# Patient Record
Sex: Female | Born: 1990 | Race: Black or African American | Hispanic: No | Marital: Married | Smoking: Former smoker
Health system: Southern US, Community
[De-identification: ages and names within clinical notes are randomized; demographics above are authoritative.]

## PROBLEM LIST (undated history)

## (undated) ENCOUNTER — Ambulatory Visit

## (undated) DIAGNOSIS — M419 Scoliosis, unspecified: Secondary | ICD-10-CM

## (undated) DIAGNOSIS — R011 Cardiac murmur, unspecified: Secondary | ICD-10-CM

## (undated) DIAGNOSIS — O039 Complete or unspecified spontaneous abortion without complication: Secondary | ICD-10-CM

## (undated) HISTORY — DX: Complete or unspecified spontaneous abortion without complication: O03.9

## (undated) HISTORY — DX: Cardiac murmur, unspecified: R01.1

## (undated) HISTORY — PX: DILATION AND CURETTAGE OF UTERUS: SHX78

## (undated) HISTORY — PX: BACK SURGERY: SHX140

---
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
## 2010-10-19 HISTORY — PX: WISDOM TOOTH EXTRACTION: SHX21

## 2018-08-01 ENCOUNTER — Other Ambulatory Visit: Payer: Self-pay

## 2018-08-01 ENCOUNTER — Encounter: Payer: Self-pay | Admitting: Emergency Medicine

## 2018-08-01 ENCOUNTER — Ambulatory Visit
Admission: EM | Admit: 2018-08-01 | Discharge: 2018-08-01 | Disposition: A | Discharge status: Home / Self Care | Payer: Self-pay | Attending: Family Medicine | Admitting: Family Medicine

## 2018-08-01 DIAGNOSIS — Z3201 Encounter for pregnancy test, result positive: Secondary | ICD-10-CM

## 2018-08-01 DIAGNOSIS — Z3490 Encounter for supervision of normal pregnancy, unspecified, unspecified trimester: Secondary | ICD-10-CM

## 2018-08-01 HISTORY — DX: Scoliosis, unspecified: M41.9

## 2018-08-01 LAB — PREGNANCY, URINE: PREG TEST UR: POSITIVE — AB

## 2018-08-01 NOTE — ED Provider Notes (Signed)
[facility]    CSN: [id number] Arrival date & time: 08/01/18  1325     History   Chief Complaint Chief Complaint  Patient presents with  . Amenorrhea    HPI Pamela Mosley is a 27 y.o. female.   27 yo female here for pregnancy test. States he last menstrual period was end of August. Denies any complaints. Denies pain, bleeding, vomiting.   The history is provided by the patient.    Past Medical History:  Diagnosis Date  . Scoliosis     There are no active problems to display for this patient.   Past Surgical History:  Procedure Laterality Date  . BACK SURGERY     for scoliosis    OB History   None      Home Medications    Prior to Admission medications   Not on File    Family History Family History  Problem Relation Age of Onset  . Hypertension Mother   . Kidney disease Mother   . Stroke Mother   . Alcohol abuse Mother   . Drug abuse Mother   . Osteoarthritis Mother   . Drug abuse Father   . Cancer Father   . Hyperlipidemia Father   . Hypertension Father     Social History Social History   Tobacco Use  . Smoking status: Current Every Day Smoker    Packs/day: 0.25    Years: 10.00    Pack years: 2.50    Types: Cigarettes  . Smokeless tobacco: Never Used  Substance Use Topics  . Alcohol use: Yes    Comment: socially  . Drug use: Yes    Frequency: 2.0 times per week    Types: Marijuana     Allergies   Latex   Review of Systems Review of Systems   Physical Exam Triage Vital Signs ED Triage Vitals  Enc Vitals Group     BP 08/01/18 1336 105/64     Pulse Rate 08/01/18 1336 83     Resp 08/01/18 1336 16     Temp 08/01/18 1336 98.8 F (37.1 C)     Temp Source 08/01/18 1336 Oral     SpO2 08/01/18 1336 100 %     Weight 08/01/18 1337 110 lb (49.9 kg)     Height 08/01/18 1337 5\' 2"  (1.575 m)     Head Circumference --      Peak Flow --      Pain Score 08/01/18 1336 0     Pain Loc --      Pain Edu? --    Excl. in GC? --    No data found.  Updated Vital Signs BP 105/64 (BP Location: Left Arm)   Pulse 83   Temp 98.8 F (37.1 C) (Oral)   Resp 16   Ht 5\' 2"  (1.575 m)   Wt 49.9 kg   LMP 06/18/2018 (Approximate)   SpO2 100%   BMI 20.12 kg/m   Visual Acuity Right Eye Distance:   Left Eye Distance:   Bilateral Distance:    Right Eye Near:   Left Eye Near:    Bilateral Near:     Physical Exam  Constitutional: She appears well-developed and well-nourished. No distress.  Skin: She is not diaphoretic.  Nursing note and vitals reviewed.    UC Treatments / Results  Labs (all labs ordered are listed, but only abnormal results are displayed) Labs Reviewed  PREGNANCY, URINE - Abnormal; Notable for the following components:  Result Value   Preg Test, Ur POSITIVE (*)    All other components within normal limits    EKG None  Radiology No results found.  Procedures Procedures (including critical care time)  Medications Ordered in UC Medications - No data to display  Initial Impression / Assessment and Plan / UC Course  I have reviewed the triage vital signs and the nursing notes.  Pertinent labs & imaging results that were available during my care of the patient were reviewed by me and considered in my medical decision making (see chart for details).      Final Clinical Impressions(s) / UC Diagnoses   Final diagnoses:  Pregnancy, unspecified gestational age     Discharge Instructions     Establish care with a OB/GYN as soon as possible Start prenatal vitamins daily    ED Prescriptions    None      1. Lab result and diagnosis reviewed with patient 2. Recommend establish care with OB/GYN; start prenatal vitamins 3. Follow-up prn   Controlled Substance Prescriptions [place] Controlled Substance Registry consulted? Not Applicable   Payton Mccallum, MD 08/01/18 1740

## 2018-08-01 NOTE — ED Triage Notes (Addendum)
Patient in today stating that she took a home pregnancy test and it was positive. Patient wants to confirm with pregnancy test here.

## 2018-08-01 NOTE — Discharge Instructions (Signed)
Establish care with a OB/GYN as soon as possible Start prenatal vitamins daily

## 2018-08-07 ENCOUNTER — Other Ambulatory Visit: Payer: Self-pay

## 2018-08-07 ENCOUNTER — Emergency Department: Payer: Medicaid Other

## 2018-08-07 ENCOUNTER — Emergency Department
Admission: EM | Admit: 2018-08-07 | Discharge: 2018-08-07 | Disposition: A | Discharge status: Home / Self Care | Payer: Medicaid Other | Attending: Emergency Medicine | Admitting: Emergency Medicine

## 2018-08-07 DIAGNOSIS — O99331 Smoking (tobacco) complicating pregnancy, first trimester: Secondary | ICD-10-CM | POA: Diagnosis not present

## 2018-08-07 DIAGNOSIS — O2341 Unspecified infection of urinary tract in pregnancy, first trimester: Secondary | ICD-10-CM | POA: Insufficient documentation

## 2018-08-07 DIAGNOSIS — O200 Threatened abortion: Secondary | ICD-10-CM | POA: Insufficient documentation

## 2018-08-07 DIAGNOSIS — Z91040 Latex allergy status: Secondary | ICD-10-CM | POA: Diagnosis not present

## 2018-08-07 DIAGNOSIS — Z79899 Other long term (current) drug therapy: Secondary | ICD-10-CM | POA: Insufficient documentation

## 2018-08-07 DIAGNOSIS — N939 Abnormal uterine and vaginal bleeding, unspecified: Secondary | ICD-10-CM

## 2018-08-07 DIAGNOSIS — F17210 Nicotine dependence, cigarettes, uncomplicated: Secondary | ICD-10-CM | POA: Insufficient documentation

## 2018-08-07 DIAGNOSIS — Z3A01 Less than 8 weeks gestation of pregnancy: Secondary | ICD-10-CM | POA: Insufficient documentation

## 2018-08-07 DIAGNOSIS — O9989 Other specified diseases and conditions complicating pregnancy, childbirth and the puerperium: Secondary | ICD-10-CM | POA: Diagnosis present

## 2018-08-07 LAB — CBC
HEMATOCRIT: 37.8 % (ref 36.0–46.0)
Hemoglobin: 13.3 g/dL (ref 12.0–15.0)
MCH: 33 pg (ref 26.0–34.0)
MCHC: 35.2 g/dL (ref 30.0–36.0)
MCV: 93.8 fL (ref 80.0–100.0)
Platelets: 247 10*3/uL (ref 150–400)
RBC: 4.03 MIL/uL (ref 3.87–5.11)
RDW: 11.6 % (ref 11.5–15.5)
WBC: 6.8 10*3/uL (ref 4.0–10.5)
nRBC: 0 % (ref 0.0–0.2)

## 2018-08-07 LAB — BASIC METABOLIC PANEL
Anion gap: 8 (ref 5–15)
BUN: 6 mg/dL (ref 6–20)
CALCIUM: 8.8 mg/dL — AB (ref 8.9–10.3)
CHLORIDE: 106 mmol/L (ref 98–111)
CO2: 21 mmol/L — ABNORMAL LOW (ref 22–32)
CREATININE: 0.46 mg/dL (ref 0.44–1.00)
GFR calc non Af Amer: >60 mL/min (ref >60)
GLUCOSE: 89 mg/dL (ref 70–99)
Potassium: 3.5 mmol/L (ref 3.5–5.1)
Sodium: 135 mmol/L (ref 135–145)

## 2018-08-07 LAB — URINALYSIS, COMPLETE (UACMP) WITH MICROSCOPIC
Bilirubin Urine: NEGATIVE
Glucose, UA: NEGATIVE mg/dL
Ketones, ur: NEGATIVE mg/dL
Nitrite: NEGATIVE
PROTEIN: NEGATIVE mg/dL
SPECIFIC GRAVITY, URINE: 1.024 (ref 1.005–1.030)
pH: 7 (ref 5.0–8.0)

## 2018-08-07 LAB — HCG, QUANTITATIVE, PREGNANCY: hCG, Beta Chain, Quant, S: 20052 m[IU]/mL — ABNORMAL HIGH (ref <5)

## 2018-08-07 LAB — CHLAMYDIA/NGC RT PCR (ARMC ONLY)
CHLAMYDIA TR: NOT DETECTED
N GONORRHOEAE: NOT DETECTED

## 2018-08-07 LAB — ABO/RH: ABO/RH(D): A POS

## 2018-08-07 MED ORDER — CEPHALEXIN 500 MG PO CAPS
500.0000 mg | ORAL_CAPSULE | Freq: Once | ORAL | Status: AC
  Administered 2018-08-07: 500 mg via ORAL
  Filled 2018-08-07: qty 1

## 2018-08-07 MED ORDER — CEPHALEXIN 500 MG PO CAPS: 500.0000 mg | ORAL_CAPSULE | Freq: Two times a day (BID) | ORAL | 0 refills | Status: DC

## 2018-08-07 NOTE — ED Provider Notes (Signed)
[facility] Emergency Department Provider Note ____________________________________________   I have reviewed the triage vital signs and the triage nursing note.  HISTORY  Chief Complaint Vaginal Bleeding   Historian Patient  HPI Pamela Mosley is a 27 y.o. female G4, P1 A2 presents for spotting in early pregnancy.  States that she is proximally [redacted] weeks pregnant based on last menstrual period.  2 prior miscarriages occurred at 10 and 11 weeks previously.  No abdominal pain.  No back pain.  She noticed some spotting in her underwear this morning and then a small amount of blood when she wiped once this morning.  Denies vaginal discharge.  Denies urinary symptoms.  No fevers.     Past Medical History:  Diagnosis Date  . Scoliosis     There are no active problems to display for this patient.   Past Surgical History:  Procedure Laterality Date  . BACK SURGERY     for scoliosis    Prior to Admission medications   Medication Sig Start Date End Date Taking? Authorizing Provider  Prenatal Vit-Fe Fumarate-FA (MULTIVITAMIN-PRENATAL) 27-0.8 MG TABS tablet Take 1 tablet by mouth daily at 12 noon.   Yes [provider]  cephALEXin (KEFLEX) 500 MG capsule Take 1 capsule (500 mg total) by mouth 2 (two) times daily. 08/07/18   Governor Rooks, MD    Allergies  Allergen Reactions  . Latex Rash    Family History  Problem Relation Age of Onset  . Hypertension Mother   . Kidney disease Mother   . Stroke Mother   . Alcohol abuse Mother   . Drug abuse Mother   . Osteoarthritis Mother   . Drug abuse Father   . Cancer Father   . Hyperlipidemia Father   . Hypertension Father     Social History Social History   Tobacco Use  . Smoking status: Current Every Day Smoker    Packs/day: 0.25    Years: 10.00    Pack years: 2.50    Types: Cigarettes  . Smokeless tobacco: Never Used  Substance Use Topics  . Alcohol use: Yes    Comment: socially   . Drug use: Yes    Frequency: 2.0 times per week    Types: Marijuana    Review of Systems  Constitutional: Negative for fever. Eyes: Negative for visual changes. ENT: Negative for sore throat. Cardiovascular: Negative for chest pain. Respiratory: Negative for shortness of breath. Gastrointestinal: Negative for abdominal pain, vomiting and diarrhea. Genitourinary: Negative for dysuria. Musculoskeletal: Negative for back pain. Skin: Negative for rash. Neurological: Negative for headache.  ____________________________________________   PHYSICAL EXAM:  VITAL SIGNS: ED Triage Vitals  Enc Vitals Group     BP 08/07/18 0737 (!) 127/59     Pulse Rate 08/07/18 0737 74     Resp 08/07/18 0737 18     Temp 08/07/18 0737 98.5 F (36.9 C)     Temp Source 08/07/18 0737 Oral     SpO2 08/07/18 0737 100 %     Weight 08/07/18 0736 115 lb (52.2 kg)     Height 08/07/18 0736 5\' 1"  (1.549 m)     Head Circumference --      Peak Flow --      Pain Score 08/07/18 0736 0     Pain Loc --      Pain Edu? --      Excl. in GC? --      Constitutional: Alert and oriented.  HEENT  Head: Normocephalic and atraumatic.      Eyes: Conjunctivae are normal. Pupils equal and round.       Ears:         Nose: No congestion/rhinnorhea.      Mouth/Throat: Mucous membranes are moist.      Neck: No stridor. Cardiovascular/Chest: Normal rate, regular rhythm.  No murmurs, rubs, or gallops. Respiratory: Normal respiratory effort without tachypnea nor retractions. Breath sounds are clear and equal bilaterally. No wheezes/rales/rhonchi. Gastrointestinal: Soft. No distention, no guarding, no rebound. Nontender.    Genitourinary/rectal: Small amount of brown discharge.  Closed cervix.  No cervicitis.  No adnexal tenderness or mass. Musculoskeletal: Nontender with normal range of motion in all extremities. No joint effusions.  No lower extremity tenderness.  No edema. Neurologic:  Normal speech and language. No  gross or focal neurologic deficits are appreciated. Skin:  Skin is warm, dry and intact. No rash noted. Psychiatric: Mood and affect are normal. Speech and behavior are normal. Patient exhibits appropriate insight and judgment.   ____________________________________________  LABS (pertinent positives/negatives) I, Governor Rooks, MD the attending physician have reviewed the labs noted below.  Labs Reviewed  BASIC METABOLIC PANEL - Abnormal; Notable for the following components:      Result Value   CO2 21 (*)    Calcium 8.8 (*)    All other components within normal limits  URINALYSIS, COMPLETE (UACMP) WITH MICROSCOPIC - Abnormal; Notable for the following components:   APPearance CLOUDY (*)    Hgb urine dipstick MODERATE (*)    Leukocytes, UA SMALL (*)    Bacteria, UA MANY (*)    All other components within normal limits  HCG, QUANTITATIVE, PREGNANCY - Abnormal; Notable for the following components:   hCG, Beta Chain, Quant, S 20,052 (*)    All other components within normal limits  URINE CULTURE  CHLAMYDIA/NGC RT PCR ([facility] ONLY)  WET PREP, GENITAL  CBC  ABO/RH    ____________________________________________    EKG I, Governor Rooks, MD, the attending physician have personally viewed and interpreted all ECGs.  None ____________________________________________  RADIOLOGY   Ultrasound pelvic transvaginal:  FINDINGS: Intrauterine gestational sac: Present  Yolk sac: Present  Embryo: Not Visualized.  Cardiac Activity: Not Visualized.  MSD: 7.2 mm  5 w  3 d  Subchorionic hemorrhage: None visualized.  Maternal uterus/adnexae: Normal.  IMPRESSION: Probable early intrauterine gestational sac, but no fetal pole, or cardiac activity yet visualized. Recommend follow-up quantitative B-HCG levels and follow-up US in 14 days to assess viability. This recommendation follows SRU consensus guidelines: Diagnostic Criteria for Nonviable Pregnancy Early in the  First Trimester. Malva Limes Med 2013; 369:1443-51. __________________________________________  PROCEDURES  Procedure(s) performed: None  Procedures  Critical Care performed: None   ____________________________________________  ED COURSE / ASSESSMENT AND PLAN  Pertinent labs & imaging results that were available during my care of the patient were reviewed by me and considered in my medical decision making (see chart for details).    Concern for threatened miscarriage - stable vital signs   UA concerning for urinary tract infection.  I will start her on Keflex.  Urine culture sent.  US showing approx 5w size GS, discussed early IUP vs. Failed IUP and need for ob gyn follow up this week, recommending 48 hour beta hcg.  No adnexal findings on [place] or on exam to raise suspicion for ectopic at this point.  Gc/chl sent, pending at time of discharge - low suspicion for std.  Wet prep timed out -  discussed with patient, will not resend, low suspicion.    CONSULTATIONS:   None   Patient / Family / Caregiver informed of clinical course, medical decision-making process, and agree with plan.   I discussed return precautions, follow-up instructions, and discharge instructions with patient and/or family.  Discharge Instructions : Return to the ER for any worsening symptoms including abdominal pain, vaginal bleeding that is heavier than a period, any dizziness or passing out, for fever, vomiting, or any other symptoms concerning to you.    ___________________________________________   FINAL CLINICAL IMPRESSION(S) / ED DIAGNOSES   Final diagnoses:  Threatened miscarriage  Urinary tract infection in mother during first trimester of pregnancy      ___________________________________________         Note: This dictation was prepared with Dragon dictation. Any transcriptional errors that result from this process are unintentional    Governor Rooks, MD 08/07/18 1154

## 2018-08-07 NOTE — ED Notes (Signed)
Lab to run hcg and ABO/Rh from blood already sent

## 2018-08-07 NOTE — ED Notes (Signed)
Patient transported to Ultrasound 

## 2018-08-07 NOTE — ED Triage Notes (Addendum)
Pt states [redacted] weeks pregnant. States "I just woke up and noticed some bleeding." denies clots. [facility] for [facility]. No pain. 4th pregnant. Has 1 living child. Lost 2 pregnancies previously.

## 2018-08-07 NOTE — Discharge Instructions (Addendum)
Return to the ER for any worsening symptoms including abdominal pain, vaginal bleeding that is heavier than a period, any dizziness or passing out, for fever, vomiting, or any other symptoms concerning to you.

## 2018-08-09 ENCOUNTER — Ambulatory Visit (INDEPENDENT_AMBULATORY_CARE_PROVIDER_SITE_OTHER): Payer: Self-pay | Admitting: Certified Nurse Midwife

## 2018-08-09 ENCOUNTER — Other Ambulatory Visit: Payer: Self-pay | Admitting: Certified Nurse Midwife

## 2018-08-09 ENCOUNTER — Encounter: Payer: Self-pay | Admitting: Certified Nurse Midwife

## 2018-08-09 VITALS — BP 90/50 | HR 60 | Ht 61.5 in | Wt 115.0 lb

## 2018-08-09 DIAGNOSIS — Z3A01 Less than 8 weeks gestation of pregnancy: Secondary | ICD-10-CM

## 2018-08-09 DIAGNOSIS — O26851 Spotting complicating pregnancy, first trimester: Secondary | ICD-10-CM

## 2018-08-09 LAB — URINE CULTURE: Culture: >=100000 — AB

## 2018-08-09 NOTE — Progress Notes (Signed)
Obstetrics & Gynecology Office Visit   Chief Complaint:  Chief Complaint  Patient presents with  . Follow-up    ED follow/up; vag bleeding; Bhcg    History of Present Illness: 27 year old G4 P1001 with LMP end of August or beginning of September was seen in ER 2 days ago for spotting in pregnancy. She had an ultrasound at that time which revealed an intrauterine gestational sac that measured 5wk2d. Her beta HCG was 20, 052. She has had no cramping with her bleeding and has had no further bleeding/spotting. She had a positive home pregnancy test 10/13 and a positive pregnancy test at the ER 10/14. Has a history of a miscarriage 2009 at 11 wks and in 2014 at 10 weeks. She also had a term SVD on 11/12/2010. Blood type A POS She had an IUD that was removed March 2019 after which she used OCPs for about a month. Her menses have been every 28-30 days, lasting 3-5 days.   Current medication: Keflex for UTI  and PNV Review of Systems:  Review of Systems  Constitutional: Negative for chills, fever and weight loss.  HENT: Negative for congestion, sinus pain and sore throat.   Eyes: Negative for blurred vision and pain.  Respiratory: Negative for hemoptysis, shortness of breath and wheezing.   Cardiovascular: Negative for chest pain, palpitations and leg swelling.  Gastrointestinal: Negative for abdominal pain, blood in stool, diarrhea, heartburn, nausea and vomiting.  Genitourinary: Negative for dysuria, frequency, hematuria and urgency.       Positive for spotting  Musculoskeletal: Negative for back pain, joint pain and myalgias.  Skin: Negative for itching and rash.  Neurological: Negative for dizziness, tingling and headaches.  Endo/Heme/Allergies: Negative for environmental allergies and polydipsia. Does not bruise/bleed easily.       Negative for hirsutism   Psychiatric/Behavioral: Negative for depression. The patient is not nervous/anxious and does not have insomnia.    Breasts:  positive for tenderness   Past Medical History:  Past Medical History:  Diagnosis Date  . Miscarriage 2009; 2014   NO SURGERY  . Scoliosis     Past Surgical History:  Past Surgical History:  Procedure Laterality Date  . BACK SURGERY     for scoliosis  . WISDOM TOOTH EXTRACTION  2012   all four    Gynecologic History: Patient's last menstrual period was 06/27/2018 (approximate).  Obstetric History: [id number]  Family History:  Family History  Problem Relation Age of Onset  . Hypertension Mother   . Kidney disease Mother   . Stroke Mother   . Alcohol abuse Mother   . Drug abuse Mother   . Osteoarthritis Mother   . Drug abuse Father   . Cancer Father   . Hyperlipidemia Father   . Hypertension Father     Social History:  Social History   Socioeconomic History  . Marital status: Single    Spouse name: Not on file  . Number of children: 1  . Years of education: 25  . Highest education level: Not on file  Occupational History  . Occupation: DENTAL ASSISTANT  Social Needs  . Financial resource strain: Not on file  . Food insecurity:    Worry: Not on file    Inability: Not on file  . Transportation needs:    Medical: Not on file    Non-medical: Not on file  Tobacco Use  . Smoking status: Current Every Day Smoker    Packs/day: 0.25    Years:  10.00    Pack years: 2.50    Types: Cigarettes  . Smokeless tobacco: Never Used  Substance and Sexual Activity  . Alcohol use: Yes    Comment: socially  . Drug use: Yes    Frequency: 2.0 times per week    Types: Marijuana  . Sexual activity: Yes    Birth control/protection: None  Lifestyle  . Physical activity:    Days per week: Not on file    Minutes per session: Not on file  . Stress: Not on file  Relationships  . Social connections:    Talks on phone: Not on file    Gets together: Not on file    Attends religious service: Not on file    Active member of club or organization: Not on file    Attends meetings  of clubs or organizations: Not on file    Relationship status: Not on file  . Intimate partner violence:    Fear of current or ex partner: Not on file    Emotionally abused: Not on file    Physically abused: Not on file    Forced sexual activity: Not on file  Other Topics Concern  . Not on file  Social History Narrative  . Not on file    Allergies:  Allergies  Allergen Reactions  . Latex Rash    Medications: Prior to Admission medications   Medication Sig Start Date End Date Taking? Authorizing Provider  cephALEXin (KEFLEX) 500 MG capsule Take 1 capsule (500 mg total) by mouth 2 (two) times daily. 08/07/18  Yes Governor Rooks, MD  Prenatal Vit-Fe Fumarate-FA (MULTIVITAMIN-PRENATAL) 27-0.8 MG TABS tablet Take 1 tablet by mouth daily at 12 noon.   Yes [provider]    Physical Exam Vitals:  Vitals:   08/09/18 1332  BP: (!) 90/50  Pulse: 60   Patient's last menstrual period was 06/27/2018 (approximate).  Physical Exam  Constitutional: She is oriented to person, place, and time. She appears well-developed and well-nourished.  Cardiovascular: Normal rate.  Respiratory: Effort normal.  GI: Soft. She exhibits no distension and no mass. There is no tenderness.  Neurological: She is alert and oriented to person, place, and time.  Skin: Skin is warm and dry.  Psychiatric: She has a normal mood and affect.     Assessment: 27 y.o. G4P1021 spotting in pregnancy Unsure LMP  Plan: Repeat quantitative beta HCG-will call with results Repeat ultrasound in 10-14 days (patient wants to wait until Medicaid kicks in November)

## 2018-08-10 LAB — BETA HCG QUANT (REF LAB): hCG Quant: 20616 m[IU]/mL

## 2018-08-16 ENCOUNTER — Other Ambulatory Visit: Payer: Self-pay | Admitting: Obstetrics & Gynecology

## 2018-08-16 DIAGNOSIS — Z0189 Encounter for other specified special examinations: Secondary | ICD-10-CM

## 2018-08-19 ENCOUNTER — Ambulatory Visit (INDEPENDENT_AMBULATORY_CARE_PROVIDER_SITE_OTHER): Payer: Medicaid Other | Admitting: Obstetrics & Gynecology

## 2018-08-19 ENCOUNTER — Other Ambulatory Visit: Payer: Self-pay | Admitting: Obstetrics & Gynecology

## 2018-08-19 ENCOUNTER — Encounter: Payer: Self-pay | Admitting: Obstetrics & Gynecology

## 2018-08-19 ENCOUNTER — Ambulatory Visit (INDEPENDENT_AMBULATORY_CARE_PROVIDER_SITE_OTHER): Payer: Medicaid Other

## 2018-08-19 VITALS — BP 100/60 | Ht 62.0 in | Wt 114.0 lb

## 2018-08-19 DIAGNOSIS — Z3A01 Less than 8 weeks gestation of pregnancy: Secondary | ICD-10-CM | POA: Diagnosis not present

## 2018-08-19 DIAGNOSIS — N8311 Corpus luteum cyst of right ovary: Secondary | ICD-10-CM

## 2018-08-19 DIAGNOSIS — O208 Other hemorrhage in early pregnancy: Secondary | ICD-10-CM

## 2018-08-19 DIAGNOSIS — Z0189 Encounter for other specified special examinations: Secondary | ICD-10-CM

## 2018-08-19 DIAGNOSIS — O3411 Maternal care for benign tumor of corpus uteri, first trimester: Secondary | ICD-10-CM | POA: Diagnosis not present

## 2018-08-19 DIAGNOSIS — O26851 Spotting complicating pregnancy, first trimester: Secondary | ICD-10-CM | POA: Insufficient documentation

## 2018-08-19 DIAGNOSIS — O209 Hemorrhage in early pregnancy, unspecified: Secondary | ICD-10-CM

## 2018-08-19 NOTE — Progress Notes (Signed)
  HPI: Pt still having spotting like bleeding here and there, no pain, no BRB, no watery discharge.  No nausea.  Ultrasound demonstrates CRL c/w 7 weeks and [facility] 04/07/19    [facility] seen.  See results  PMHx: She  has a past medical history of Miscarriage (2009; 2014) and Scoliosis. Also,  has a past surgical history that includes Back surgery and Wisdom tooth extraction (2012)., family history includes Alcohol abuse in her mother; Cancer in her father; Drug abuse in her father and mother; Hyperlipidemia in her father; Hypertension in her father and mother; Kidney disease in her mother; Osteoarthritis in her mother; Stroke in her mother.,  reports that she has been smoking cigarettes. She has a 2.50 pack-year smoking history. She has never used smokeless tobacco. She reports that she drinks alcohol. She reports that she has current or past drug history. Drug: Marijuana. Frequency: 2.00 times per week.  She has a current medication list which includes the following prescription(s): multivitamin-prenatal and cephalexin. Also, is allergic to latex.  Review of Systems  All other systems reviewed and are negative.   Objective: BP 100/60   Ht 5\' 2"  (1.575 m)   Wt 114 lb (51.7 kg)   LMP 06/27/2018 (Approximate)   BMI 20.85 kg/m   Physical examination Constitutional NAD, Conversant  Skin No rashes, lesions or ulceration.   Extremities: Moves all appropriately.  Normal ROM for age. No lymphadenopathy.  Neuro: Grossly intact  Psych: Oriented to PPT.  Normal mood. Normal affect.   Assessment:  Vaginal bleeding affecting early pregnancy    Plan NOB w labs and PAP Risks of [facility] discussed    [place] 4 weeks, sooner if bleeding EDC now 04/07/19 (add to prenatal flowsheet once established)    Pt plans [facility] here instead of [facility] PNV  Annamarie Major, MD, [facility], [facility] 08/19/2018  4:53 PM

## 2018-08-20 ENCOUNTER — Encounter: Payer: Self-pay | Admitting: Certified Nurse Midwife

## 2018-08-24 ENCOUNTER — Other Ambulatory Visit: Payer: Self-pay

## 2018-08-24 ENCOUNTER — Encounter: Payer: Self-pay | Admitting: Obstetrics & Gynecology

## 2018-08-31 ENCOUNTER — Other Ambulatory Visit: Payer: Self-pay

## 2018-08-31 ENCOUNTER — Emergency Department: Payer: Medicaid Other

## 2018-08-31 ENCOUNTER — Emergency Department
Admission: EM | Admit: 2018-08-31 | Discharge: 2018-09-01 | Disposition: A | Discharge status: Home / Self Care | Payer: Medicaid Other | Attending: Emergency Medicine | Admitting: Emergency Medicine

## 2018-08-31 DIAGNOSIS — O418X11 Other specified disorders of amniotic fluid and membranes, first trimester, fetus 1: Secondary | ICD-10-CM | POA: Insufficient documentation

## 2018-08-31 DIAGNOSIS — Z79899 Other long term (current) drug therapy: Secondary | ICD-10-CM | POA: Insufficient documentation

## 2018-08-31 DIAGNOSIS — Z87891 Personal history of nicotine dependence: Secondary | ICD-10-CM | POA: Diagnosis not present

## 2018-08-31 DIAGNOSIS — O468X1 Other antepartum hemorrhage, first trimester: Secondary | ICD-10-CM | POA: Diagnosis not present

## 2018-08-31 DIAGNOSIS — Z3A08 8 weeks gestation of pregnancy: Secondary | ICD-10-CM | POA: Diagnosis not present

## 2018-08-31 DIAGNOSIS — O209 Hemorrhage in early pregnancy, unspecified: Secondary | ICD-10-CM | POA: Diagnosis present

## 2018-08-31 DIAGNOSIS — O200 Threatened abortion: Secondary | ICD-10-CM

## 2018-08-31 DIAGNOSIS — N939 Abnormal uterine and vaginal bleeding, unspecified: Secondary | ICD-10-CM

## 2018-08-31 LAB — CBC WITH DIFFERENTIAL/PLATELET
ABS IMMATURE GRANULOCYTES: 0.04 10*3/uL (ref 0.00–0.07)
Basophils Absolute: 0 10*3/uL (ref 0.0–0.1)
Basophils Relative: 0 %
EOS PCT: 1 %
Eosinophils Absolute: 0.1 10*3/uL (ref 0.0–0.5)
HEMATOCRIT: 34.2 % — AB (ref 36.0–46.0)
Hemoglobin: 11.9 g/dL — ABNORMAL LOW (ref 12.0–15.0)
Immature Granulocytes: 0 %
LYMPHS ABS: 2 10*3/uL (ref 0.7–4.0)
Lymphocytes Relative: 21 %
MCH: 33 pg (ref 26.0–34.0)
MCHC: 34.8 g/dL (ref 30.0–36.0)
MCV: 94.7 fL (ref 80.0–100.0)
MONO ABS: 0.6 10*3/uL (ref 0.1–1.0)
MONOS PCT: 7 %
NEUTROS ABS: 6.6 10*3/uL (ref 1.7–7.7)
Neutrophils Relative %: 71 %
Platelets: 250 10*3/uL (ref 150–400)
RBC: 3.61 MIL/uL — ABNORMAL LOW (ref 3.87–5.11)
RDW: 11.2 % — ABNORMAL LOW (ref 11.5–15.5)
WBC: 9.3 10*3/uL (ref 4.0–10.5)
nRBC: 0 % (ref 0.0–0.2)

## 2018-08-31 LAB — URINALYSIS, COMPLETE (UACMP) WITH MICROSCOPIC
Bacteria, UA: NONE SEEN
Bilirubin Urine: NEGATIVE
GLUCOSE, UA: NEGATIVE mg/dL
Ketones, ur: NEGATIVE mg/dL
Nitrite: NEGATIVE
PH: 6 (ref 5.0–8.0)
PROTEIN: NEGATIVE mg/dL
Specific Gravity, Urine: 1.025 (ref 1.005–1.030)

## 2018-08-31 LAB — BASIC METABOLIC PANEL
Anion gap: 4 — ABNORMAL LOW (ref 5–15)
BUN: 7 mg/dL (ref 6–20)
CHLORIDE: 105 mmol/L (ref 98–111)
CO2: 28 mmol/L (ref 22–32)
CREATININE: 0.46 mg/dL (ref 0.44–1.00)
Calcium: 8.9 mg/dL (ref 8.9–10.3)
GFR calc Af Amer: >60 mL/min (ref >60)
GFR calc non Af Amer: >60 mL/min (ref >60)
Glucose, Bld: 88 mg/dL (ref 70–99)
POTASSIUM: 3.7 mmol/L (ref 3.5–5.1)
Sodium: 137 mmol/L (ref 135–145)

## 2018-08-31 LAB — HCG, QUANTITATIVE, PREGNANCY: HCG, BETA CHAIN, QUANT, S: 111651 m[IU]/mL — AB (ref <5)

## 2018-08-31 LAB — WET PREP, GENITAL
CLUE CELLS WET PREP: NONE SEEN
SPERM: NONE SEEN
Trich, Wet Prep: NONE SEEN
Yeast Wet Prep HPF POC: NONE SEEN

## 2018-08-31 MED ORDER — SODIUM CHLORIDE 0.9 % IV BOLUS
1000.0000 mL | Freq: Once | INTRAVENOUS | Status: AC
  Administered 2018-08-31: 1000 mL via INTRAVENOUS

## 2018-08-31 NOTE — ED Provider Notes (Signed)
[facility] EMERGENCY DEPARTMENT Provider Note   CSN: [id number] Arrival date & time: 08/31/18  2107     History   Chief Complaint Chief Complaint  Patient presents with  . Vaginal Bleeding    HPI Natalye Kott is a 27 y.o. female G4P1A2, here presenting with vaginal bleeding.  Patient is approximately [redacted] weeks pregnant by recent ultrasound.  Patient states that she has sexual intercourse earlier today.  She then subsequently had a gush of blood and then had to change one pad.  Patient states that she was seen in the ED about 2 weeks ago and had [place] that showed possible 6 week IUP. Saw Dr. Tiburcio Pea from [facility] about a week ago for prenatal care. She is Rh positive.   The history is provided by the patient.    Past Medical History:  Diagnosis Date  . Miscarriage 2009; 2014   NO SURGERY  . Scoliosis     Patient Active Problem List   Diagnosis Date Noted  . Spotting affecting pregnancy in first trimester 08/19/2018    Past Surgical History:  Procedure Laterality Date  . BACK SURGERY     for scoliosis  . WISDOM TOOTH EXTRACTION  2012   all four     OB History    Gravida  4   Para  1   Term  1   Preterm      AB  2   Living  1     SAB  2   TAB      Ectopic      Multiple      Live Births  1            Home Medications    Prior to Admission medications   Medication Sig Start Date End Date Taking? Authorizing Provider  cephALEXin (KEFLEX) 500 MG capsule Take 1 capsule (500 mg total) by mouth 2 (two) times daily. Patient not taking: Reported on 08/19/2018 08/07/18   Governor Rooks, MD  Prenatal Vit-Fe Fumarate-FA (MULTIVITAMIN-PRENATAL) 27-0.8 MG TABS tablet Take 1 tablet by mouth daily at 12 noon.    [provider]    Family History Family History  Problem Relation Age of Onset  . Hypertension Mother   . Kidney disease Mother   . Stroke Mother   . Alcohol abuse Mother   . Drug abuse Mother   .  Osteoarthritis Mother   . Drug abuse Father   . Cancer Father   . Hyperlipidemia Father   . Hypertension Father     Social History Social History   Tobacco Use  . Smoking status: Former Smoker    Packs/day: 0.25    Years: 10.00    Pack years: 2.50    Types: Cigarettes  . Smokeless tobacco: Never Used  Substance Use Topics  . Alcohol use: Yes    Comment: socially  . Drug use: Yes    Frequency: 2.0 times per week    Types: Marijuana     Allergies   Latex   Review of Systems Review of Systems  Genitourinary: Positive for vaginal bleeding.  All other systems reviewed and are negative.    Physical Exam Updated Vital Signs BP 121/63 (BP Location: Right Arm)   Pulse 73   Temp 98.4 F (36.9 C) (Oral)   Resp 18   Ht 5\' 2"  (1.575 m)   Wt 50.3 kg   LMP 06/27/2018 (Approximate)   SpO2 100%   BMI 20.30 kg/m  Physical Exam  Constitutional: She appears well-developed.  Slightly anxious, tearful   HENT:  Head: Normocephalic.  Eyes: Pupils are equal, round, and reactive to light. Conjunctivae and EOM are normal.  Neck: Normal range of motion. Neck supple.  Cardiovascular: Normal rate, regular rhythm and normal heart sounds.  Pulmonary/Chest: Effort normal and breath sounds normal. No stridor. No respiratory distress.  Abdominal: Soft. Bowel sounds are normal. She exhibits no distension. There is no tenderness.  Genitourinary:  Genitourinary Comments: Brownish, dark red discharge, no obvious blood clots or products of conception. Os closed   Musculoskeletal: Normal range of motion.  Neurological: She is alert.  Skin: Skin is warm. Capillary refill takes less than 2 seconds.  Psychiatric: She has a normal mood and affect.  Nursing note and vitals reviewed.    ED Treatments / Results  Labs (all labs ordered are listed, but only abnormal results are displayed) Labs Reviewed  WET PREP, GENITAL - Abnormal; Notable for the following components:      Result Value     WBC, Wet Prep HPF POC FEW (*)    All other components within normal limits  CBC WITH DIFFERENTIAL/PLATELET - Abnormal; Notable for the following components:   RBC 3.61 (*)    Hemoglobin 11.9 (*)    HCT 34.2 (*)    RDW 11.2 (*)    All other components within normal limits  BASIC METABOLIC PANEL - Abnormal; Notable for the following components:   Anion gap 4 (*)    All other components within normal limits  URINALYSIS, COMPLETE (UACMP) WITH MICROSCOPIC - Abnormal; Notable for the following components:   Color, Urine YELLOW (*)    APPearance HAZY (*)    Hgb urine dipstick LARGE (*)    Leukocytes, UA SMALL (*)    All other components within normal limits  CHLAMYDIA/NGC RT PCR ([facility] ONLY)  HCG, QUANTITATIVE, PREGNANCY    EKG None  Radiology No results found.  Procedures Procedures (including critical care time)  Medications Ordered in ED Medications  sodium chloride 0.9 % bolus 1,000 mL (1,000 mLs Intravenous New Bag/Given 08/31/18 2226)     Initial Impression / Assessment and Plan / ED Course  I have reviewed the triage vital signs and the nursing notes.  Pertinent labs & imaging results that were available during my care of the patient were reviewed by me and considered in my medical decision making (see chart for details).    Marcos EkeLauren Gosse is a 27 y.o. female [redacted] weeks pregnant here with vaginal bleeding. Had two previous miscarriages. Os is closed. Will get labs, HCG, transvag US.    11:22 PM HCG pending. US pending. Signed out to Dr. Lamont Snowballifenbark in the ED.   Final Clinical Impressions(s) / ED Diagnoses   Final diagnoses:  Vaginal bleeding    ED Discharge Orders    None       Charlynne PanderYao, Cannan Beeck Hsienta, MD 08/31/18 2323

## 2018-08-31 NOTE — ED Triage Notes (Signed)
Pt to the er for vaginal bleeding that started after intercourse. Pt says it started an hour ago and has gone through one pad. Pt is [redacted] weeks pregnant

## 2018-09-01 ENCOUNTER — Ambulatory Visit (INDEPENDENT_AMBULATORY_CARE_PROVIDER_SITE_OTHER): Payer: Medicaid Other | Admitting: Obstetrics and Gynecology

## 2018-09-01 ENCOUNTER — Encounter: Payer: Self-pay | Admitting: Obstetrics and Gynecology

## 2018-09-01 ENCOUNTER — Telehealth: Payer: Self-pay

## 2018-09-01 DIAGNOSIS — M419 Scoliosis, unspecified: Secondary | ICD-10-CM

## 2018-09-01 DIAGNOSIS — O9989 Other specified diseases and conditions complicating pregnancy, childbirth and the puerperium: Secondary | ICD-10-CM | POA: Diagnosis not present

## 2018-09-01 DIAGNOSIS — R768 Other specified abnormal immunological findings in serum: Secondary | ICD-10-CM

## 2018-09-01 DIAGNOSIS — Z3480 Encounter for supervision of other normal pregnancy, unspecified trimester: Secondary | ICD-10-CM

## 2018-09-01 DIAGNOSIS — Z3A09 9 weeks gestation of pregnancy: Secondary | ICD-10-CM | POA: Diagnosis not present

## 2018-09-01 DIAGNOSIS — Z8742 Personal history of other diseases of the female genital tract: Secondary | ICD-10-CM | POA: Insufficient documentation

## 2018-09-01 LAB — CHLAMYDIA/NGC RT PCR (ARMC ONLY)
Chlamydia Tr: NOT DETECTED
N gonorrhoeae: NOT DETECTED

## 2018-09-01 NOTE — Telephone Encounter (Signed)
Pt called after  Hour nurse last evening c/o [id number] preg and bleeding a lot after intercourse. Was adv by after hour nurse to be seen within three days. Detailed msg was left this am encouraging pt to keep her appt this afternoon c AMS in [place].

## 2018-09-01 NOTE — Progress Notes (Signed)
New Obstetric Patient H&P    Chief Complaint: "Desires prenatal care"   History of Present Illness: Patient is a 27 y.o. [id number] Not Hispanic or Latino female, presents with amenorrhea and positive home pregnancy test. Patient's last menstrual period was 06/27/2018 (approximate). and based on her  LMP, her EDD is Estimated Date of Delivery: 04/07/19 and her EGA is [redacted]w[redacted]d.  Her last pap smear was 03/19/16 pap NIL.  She had a urine pregnancy test which was positive 3 week(s)  ago. Her last menstrual period was normal.  Since her LMP she claims she has experienced some nausea, fatigue, breast tenderness. She has had vaginal bleeding, ER ultrasound showing viable IUP with moderate SCH. Her past medical history is contibutory for history of scoliosis. Her prior pregnancies are notable for none  Since her LMP, she admits to the use of tobacco products  no She claims she has gained   no pounds since the start of her pregnancy.  There are cats in the home in the home  no  She admits close contact with children on a regular basis  yes (her 27 year old) She has had chicken pox in the past no She has had Tuberculosis exposures, symptoms, or previously tested positive for TB   no Current or past history of domestic violence. no  Genetic Screening/Teratology Counseling: (Includes patient, baby's father, or anyone in either family with:)   1. Patient's age >/= 16 at [facility]  no 2. Thalassemia ([place], [place], Mediterranean, or Asian background): MCV<80  no 3. Neural tube defect (meningomyelocele, spina bifida, anencephaly)  no 4. Congenital heart defect  no  5. Down syndrome  no 6. Tay-Sachs (Jewish, [place]  no 7. Canavan's Disease  no 8. Sickle cell disease or trait (African)  no  9. Hemophilia or other blood disorders  no  10. Muscular dystrophy  no  11. Cystic fibrosis  no  12. Huntington's Chorea  no  13. Mental retardation/autism  no 14. Other inherited genetic or chromosomal disorder   no 15. Maternal metabolic disorder (DM, PKU, etc)  no 16. Patient or FOB with a child with a birth defect not listed above no  16a. Patient or FOB with a birth defect themselves no 17. Recurrent pregnancy loss, or stillbirth  no  18. Any medications since LMP other than prenatal vitamins (include vitamins, supplements, OTC meds, drugs, alcohol)  no 19. Any other genetic/environmental exposure to discuss  no  Infection History:   1. Lives with someone with TB or TB exposed  no  2. Patient or partner has history of genital herpes  ? 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 Rash or viral illness since LMP  no 4. History of STI (GC, CT, HPV, syphilis, HIV)  Yes remote history of chlamydia 5. History of recent travel :  no  Other pertinent information:  no     Review of Systems:10 point review of systems negative unless otherwise noted in HPI  Past Medical History:  Past Medical History:  Diagnosis Date  . Miscarriage 2009; 2014   NO SURGERY  . Scoliosis     Past Surgical History:  Past Surgical History:  Procedure Laterality Date  . BACK SURGERY     for scoliosis  . WISDOM TOOTH EXTRACTION  2012   all four    Gynecologic History: Patient's last menstrual period was 06/27/2018 (approximate).  Obstetric History: [id number]  Family History:  Family History  Problem Relation Age of Onset  .  Hypertension Mother   . Kidney disease Mother   . Stroke Mother   . Alcohol abuse Mother   . Drug abuse Mother   . Osteoarthritis Mother   . Drug abuse Father   . Cancer Father   . Hyperlipidemia Father   . Hypertension Father     Social History:  Social History   Socioeconomic History  . Marital status: Single    Spouse name: Not on file  . Number of children: 1  . Years of education: 76  . Highest education level: Not on file  Occupational History  . Occupation: DENTAL ASSISTANT  Social Needs  . Financial resource strain: Not on file  . Food insecurity:     Worry: Not on file    Inability: Not on file  . Transportation needs:    Medical: Not on file    Non-medical: Not on file  Tobacco Use  . Smoking status: Former Smoker    Packs/day: 0.25    Years: 10.00    Pack years: 2.50    Types: Cigarettes  . Smokeless tobacco: Never Used  Substance and Sexual Activity  . Alcohol use: Yes    Comment: socially  . Drug use: Yes    Frequency: 2.0 times per week    Types: Marijuana  . Sexual activity: Yes    Birth control/protection: None  Lifestyle  . Physical activity:    Days per week: Not on file    Minutes per session: Not on file  . Stress: Not on file  Relationships  . Social connections:    Talks on phone: Not on file    Gets together: Not on file    Attends religious service: Not on file    Active member of club or organization: Not on file    Attends meetings of clubs or organizations: Not on file    Relationship status: Not on file  . Intimate partner violence:    Fear of current or ex partner: Not on file    Emotionally abused: Not on file    Physically abused: Not on file    Forced sexual activity: Not on file  Other Topics Concern  . Not on file  Social History Narrative  . Not on file    Allergies:  Allergies  Allergen Reactions  . Latex Rash    Medications: Prior to Admission medications   Medication Sig Start Date End Date Taking? Authorizing Provider  Prenatal Vit-Fe Fumarate-FA (MULTIVITAMIN-PRENATAL) 27-0.8 MG TABS tablet Take 1 tablet by mouth daily at 12 noon.   Yes [provider]    Physical Exam Vitals: Blood pressure 105/65, pulse 76, weight 116 lb (52.6 kg), last menstrual period 06/27/2018.  General: NAD HEENT: normocephalic, anicteric Pulmonary: No increased work of breathing, CTAB Abdomen: NABS, soft, non-tender, non-distended.  Umbilicus without lesions.  No hepatomegaly, splenomegaly or masses palpable. No evidence of hernia  Extremities: no edema, erythema, or  tenderness Neurologic: Grossly intact Psychiatric: mood appropriate, affect full   Assessment: 27 y.o. [id number] at [redacted]w[redacted]d presenting to initiate prenatal care  Plan: 1) Avoid alcoholic beverages. 2) Patient encouraged not to smoke.  3) Discontinue the use of all non-medicinal drugs and chemicals.  4) Take prenatal vitamins daily.  5) Nutrition, food safety (fish, cheese advisories, and high nitrite foods) and exercise discussed. 6) Hospital and practice style discussed with cross coverage system.  7) Genetic Screening, such as with 1st Trimester Screening, cell free fetal DNA, AFP testing, and Ultrasound, as well as  with amniocentesis and CVS as appropriate, is discussed with patient. At the conclusion of today's visit patient requested genetic testing 8) Patient is asked about travel to areas at risk for the Zika virus, and counseled to avoid travel and exposure to mosquitoes or sexual partners who may have themselves been exposed to the virus. Testing is discussed, and will be ordered as appropriate.   Vena AustriaAndreas Johannes Everage, MD, Merlinda FrederickFACOG [facility], [facility]

## 2018-09-01 NOTE — Progress Notes (Signed)
NOB Vaginal bleeding/[facility] ER 11/13

## 2018-09-01 NOTE — ED Provider Notes (Signed)
Pelvic ultrasound shows an IUP with a normal heart rate and a moderate subchorionic hemorrhage.  The patient is Rh+.  She has no bacteria in her urine.  She is able to follow-up this coming Monday.  She is discharged home in stable condition.   Merrily Brittleifenbark, Marcellous Snarski, MD 09/01/18 [phone]

## 2018-09-01 NOTE — Discharge Instructions (Signed)
Fortunately today your ultrasound was reassuring and your fetus appears healthy.  It is still possible that you could have a miscarriage so please maintain strict pelvic rest until you are able to follow-up with your OB gynecologist this coming Monday for recheck.  Return to the emergency department sooner for any concerns.  It was a pleasure to take care of you today, and thank you for coming to our emergency department.  If you have any questions or concerns before leaving please ask the nurse to grab me and I'm more than happy to go through your aftercare instructions again.  If you were prescribed any opioid pain medication today such as Norco, Vicodin, Percocet, morphine, hydrocodone, or oxycodone please make sure you do not drive when you are taking this medication as it can alter your ability to drive safely.  If you have any concerns once you are home that you are not improving or are in fact getting worse before you can make it to your follow-up appointment, please do not hesitate to call 911 and come back for further evaluation.  Merrily BrittleNeil Ligia Duguay, MD  Results for orders placed or performed during the hospital encounter of 08/31/18  Wet prep, genital  Result Value Ref Range   Yeast Wet Prep HPF POC NONE SEEN NONE SEEN   Trich, Wet Prep NONE SEEN NONE SEEN   Clue Cells Wet Prep HPF POC NONE SEEN NONE SEEN   WBC, Wet Prep HPF POC FEW (A) NONE SEEN   Sperm NONE SEEN   Chlamydia/NGC rt PCR ([facility] only)  Result Value Ref Range   Specimen source GC/Chlam ENDOCERVICAL    Chlamydia Tr NOT DETECTED NOT DETECTED   N gonorrhoeae NOT DETECTED NOT DETECTED  CBC with Differential/Platelet  Result Value Ref Range   WBC 9.3 4.0 - 10.5 K/uL   RBC 3.61 (L) 3.87 - 5.11 MIL/uL   Hemoglobin 11.9 (L) 12.0 - 15.0 g/dL   HCT 16.134.2 (L) 09.636.0 - 04.546.0 %   MCV 94.7 80.0 - 100.0 fL   MCH 33.0 26.0 - 34.0 pg   MCHC 34.8 30.0 - 36.0 g/dL   RDW 40.911.2 (L) 81.111.5 - 91.415.5 %   Platelets 250 150 - 400 K/uL   nRBC 0.0  0.0 - 0.2 %   Neutrophils Relative % 71 %   Neutro Abs 6.6 1.7 - 7.7 K/uL   Lymphocytes Relative 21 %   Lymphs Abs 2.0 0.7 - 4.0 K/uL   Monocytes Relative 7 %   Monocytes Absolute 0.6 0.1 - 1.0 K/uL   Eosinophils Relative 1 %   Eosinophils Absolute 0.1 0.0 - 0.5 K/uL   Basophils Relative 0 %   Basophils Absolute 0.0 0.0 - 0.1 K/uL   Immature Granulocytes 0 %   Abs Immature Granulocytes 0.04 0.00 - 0.07 K/uL  Basic metabolic panel  Result Value Ref Range   Sodium 137 135 - 145 mmol/L   Potassium 3.7 3.5 - 5.1 mmol/L   Chloride 105 98 - 111 mmol/L   CO2 28 22 - 32 mmol/L   Glucose, Bld 88 70 - 99 mg/dL   BUN 7 6 - 20 mg/dL   Creatinine, Ser 7.820.46 0.44 - 1.00 mg/dL   Calcium 8.9 8.9 - 95.610.3 mg/dL   GFR calc non Af Amer >60 >60 mL/min   GFR calc Af Amer >60 >60 mL/min   Anion gap 4 (L) 5 - 15  Urinalysis, Complete w Microscopic  Result Value Ref Range   Color, Urine YELLOW (A)  YELLOW   APPearance HAZY (A) CLEAR   Specific Gravity, Urine 1.025 1.005 - 1.030   pH 6.0 5.0 - 8.0   Glucose, UA NEGATIVE NEGATIVE mg/dL   Hgb urine dipstick LARGE (A) NEGATIVE   Bilirubin Urine NEGATIVE NEGATIVE   Ketones, ur NEGATIVE NEGATIVE mg/dL   Protein, ur NEGATIVE NEGATIVE mg/dL   Nitrite NEGATIVE NEGATIVE   Leukocytes, UA SMALL (A) NEGATIVE   RBC / HPF 21-50 0 - 5 RBC/hpf   WBC, UA 21-50 0 - 5 WBC/hpf   Bacteria, UA NONE SEEN NONE SEEN   Squamous Epithelial / LPF 0-5 0 - 5   Mucus PRESENT   hCG, quantitative, pregnancy  Result Value Ref Range   hCG, Beta Chain, Quant, S 111,651 (H) <5 mIU/mL   US Ob Comp Less 14 Wks  Result Date: 08/19/2018 Patient Name: Pamela Mosley DOB: November 25, 1990 MRN: [id number] ULTRASOUND REPORT Location: [facility] Date of Service: 08/19/2018 Indications:dating Findings: Mason Jim intrauterine pregnancy is visualized with a CRL consistent with [redacted]w[redacted]d gestation, giving an (U/S) EDD of 04/07/2019. The (U/S) EDD is not consistent with the clinically established EDD of  03/31/2019. FHR: 130 BPM CRL measurement: 9.5 mm Yolk sac is visualized and appears normal and early anatomy is normal. Amnion: visualized and appears normal [facility] seen uterus left measuring 3.9 x 3.2 x 1.6cm. Right Ovary is normal in appearance. Left Ovary is normal appearance. Corpus luteal cyst:  Right ovary Survey of the adnexa demonstrates no adnexal masses. There is no free peritoneal fluid in the cul de sac. Impression: 1. [redacted]w[redacted]d Viable Singleton Intrauterine pregnancy by U/S. 2. (U/S) EDD is not consistent with Clinically established EDD; [place] EDD is 04/07/2019. 3. Subchorionic hemorrhage seen measuring 3.9 x 3.2 x 1.6cm Recommendations: 1.Clinical correlation with the patient's History and Physical Exam. Darlina Guys, RDMS RVT Review of ULTRASOUND.    I have personally reviewed images and report of recent ultrasound done at [facility].    Plan of management to be discussed with patient. Annamarie Major, MD, [facility], [facility] 08/19/2018  5:04 PM   US Ob Comp Less 14 Wks  Result Date: 08/07/2018 CLINICAL DATA:  Vaginal bleeding in a pregnant patient. EXAM: OBSTETRIC <14 WK [place] AND TRANSVAGINAL OB US TECHNIQUE: Both transabdominal and transvaginal ultrasound examinations were performed for complete evaluation of the gestation as well as the maternal uterus, adnexal regions, and pelvic cul-de-sac. Transvaginal technique was performed to assess early pregnancy. COMPARISON:  None. FINDINGS: Intrauterine gestational sac: Present Yolk sac:  Present Embryo:  Not Visualized. Cardiac Activity: Not Visualized. MSD: 7.2 mm   5 w   3 d Subchorionic hemorrhage:  None visualized. Maternal uterus/adnexae: Normal. IMPRESSION: Probable early intrauterine gestational sac, but no fetal pole, or cardiac activity yet visualized. Recommend follow-up quantitative B-HCG levels and follow-up US in 14 days to assess viability. This recommendation follows SRU consensus guidelines: Diagnostic Criteria for  Nonviable Pregnancy Early in the First Trimester. Malva Limes Med 2013; 409:8119-14. Electronically Signed   By: Ted Mcalpine M.D.   On: 08/07/2018 10:26   US Ob Transvaginal  Result Date: 08/07/2018 CLINICAL DATA:  Vaginal bleeding in a pregnant patient. EXAM: OBSTETRIC <14 WK [place] AND TRANSVAGINAL OB US TECHNIQUE: Both transabdominal and transvaginal ultrasound examinations were performed for complete evaluation of the gestation as well as the maternal uterus, adnexal regions, and pelvic cul-de-sac. Transvaginal technique was performed to assess early pregnancy. COMPARISON:  None. FINDINGS: Intrauterine gestational sac: Present Yolk sac:  Present Embryo:  Not Visualized. Cardiac Activity: Not Visualized. MSD: 7.2 mm   5 w   3 d Subchorionic hemorrhage:  None visualized. Maternal uterus/adnexae: Normal. IMPRESSION: Probable early intrauterine gestational sac, but no fetal pole, or cardiac activity yet visualized. Recommend follow-up quantitative B-HCG levels and follow-up US in 14 days to assess viability. This recommendation follows SRU consensus guidelines: Diagnostic Criteria for Nonviable Pregnancy Early in the First Trimester. Malva Limes Med 2013; 161:0960-45. Electronically Signed   By: Ted Mcalpine M.D.   On: 08/07/2018 10:26   US Ob Less Than 14 Weeks With Ob Transvaginal  Result Date: 09/01/2018 CLINICAL DATA:  Acute onset of vaginal bleeding. EXAM: OBSTETRIC <14 WK [place] AND TRANSVAGINAL OB US TECHNIQUE: Both transabdominal and transvaginal ultrasound examinations were performed for complete evaluation of the gestation as well as the maternal uterus, adnexal regions, and pelvic cul-de-sac. Transvaginal technique was performed to assess early pregnancy. COMPARISON:  Pelvic ultrasound performed 08/19/2018 FINDINGS: Intrauterine gestational sac: Single; visualized and normal in shape. Yolk sac:  Yes Embryo:  Yes Cardiac Activity: Yes Heart Rate: 175 bpm CRL: 21.8  mm   8 w   6 d                   [place] EDC: 04/06/2019 Subchorionic hemorrhage: A moderate amount of subchorionic hemorrhage is noted. Maternal uterus/adnexae: The uterus is otherwise unremarkable in appearance. The ovaries are within normal limits. There is no evidence for ovarian torsion. No suspicious adnexal masses are seen. Trace free fluid is seen within the pelvic cul-de-sac. IMPRESSION: 1. Single live intrauterine pregnancy, with a crown-rump length of 2.2 cm, corresponding to a gestational age of [redacted] weeks 6 days. This matches the gestational age of [redacted] weeks 2 days by LMP, reflecting an estimated date of delivery of April 03, 2019. 2. Moderate amount of subchorionic hemorrhage noted. Electronically Signed   By: Roanna Raider M.D.   On: 09/01/2018 00:29

## 2018-09-05 DIAGNOSIS — R768 Other specified abnormal immunological findings in serum: Secondary | ICD-10-CM | POA: Insufficient documentation

## 2018-09-05 DIAGNOSIS — M419 Scoliosis, unspecified: Secondary | ICD-10-CM | POA: Insufficient documentation

## 2018-09-05 DIAGNOSIS — Z3480 Encounter for supervision of other normal pregnancy, unspecified trimester: Secondary | ICD-10-CM | POA: Insufficient documentation

## 2018-09-23 ENCOUNTER — Encounter: Payer: Self-pay | Admitting: Advanced Practice Midwife

## 2018-11-29 DIAGNOSIS — F4320 Adjustment disorder, unspecified: Secondary | ICD-10-CM | POA: Insufficient documentation

## 2018-12-14 LAB — HM PAP SMEAR: HM Pap smear: NEGATIVE

## 2018-12-16 IMAGING — US US OB TRANSVAGINAL
1 series · 14 of 28 positions shown · non-contrast
Comparison: None.

CLINICAL DATA: Vaginal bleeding in a pregnant patient.

EXAM:
OBSTETRIC <14 WK US AND TRANSVAGINAL OB US
TECHNIQUE: Both transabdominal and transvaginal ultrasound examinations were
performed for complete evaluation of the gestation as well as the
maternal uterus, adnexal regions, and pelvic cul-de-sac.
Transvaginal technique was performed to assess early pregnancy.

[Series 1: us ob transvaginal · 14 of 62 slices shown]
[im 3/62]
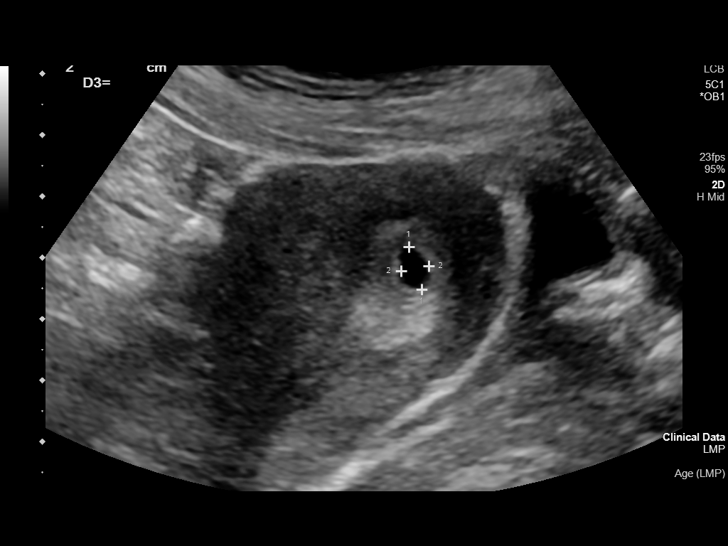
[im 7/62]
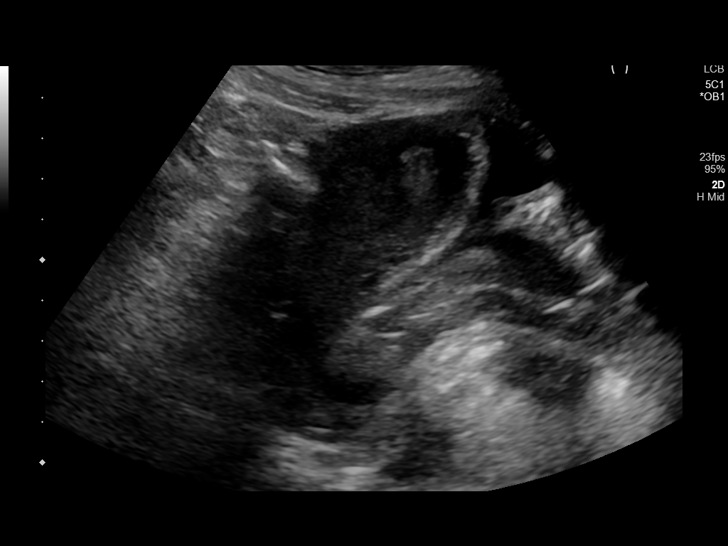
[im 12/62]
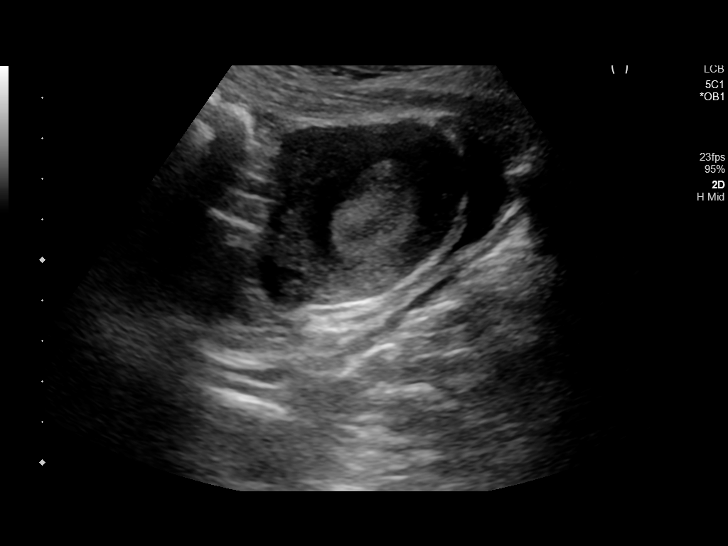
[im 16/62]
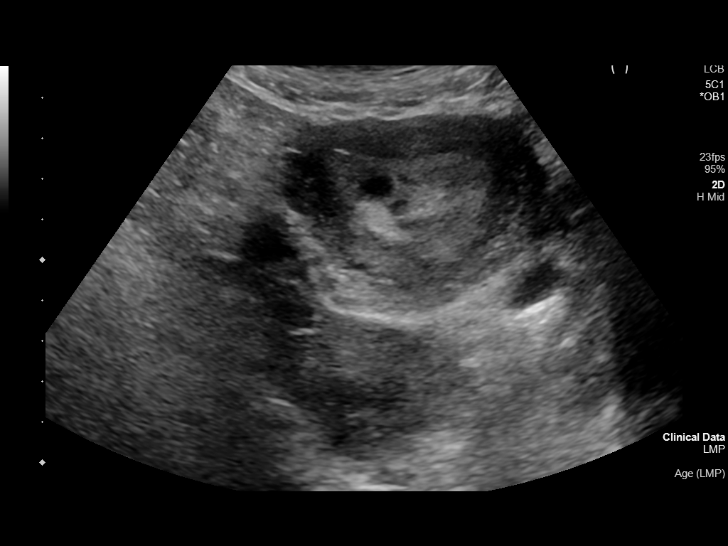
[im 21/62]
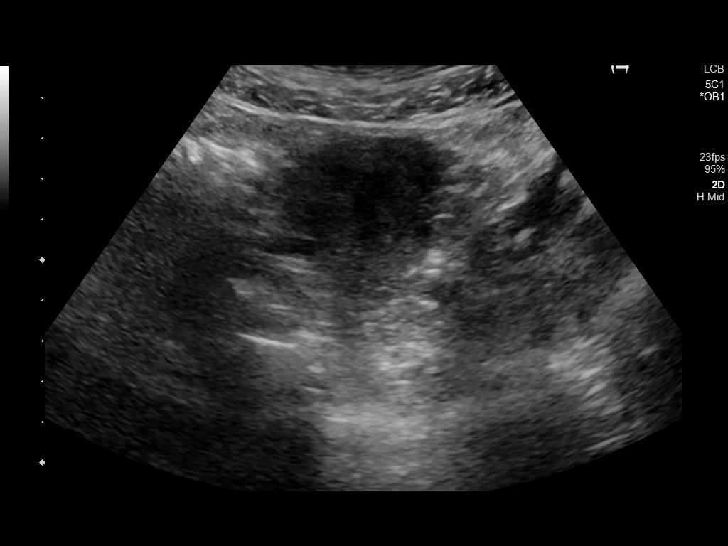
[im 25/62]
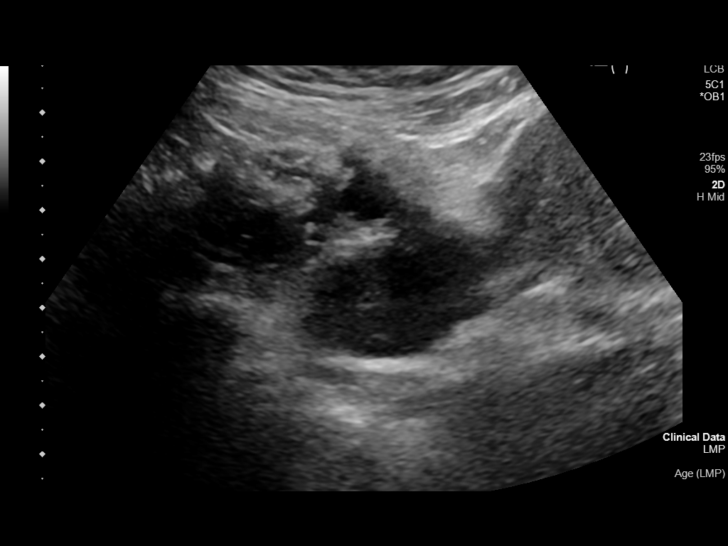
[im 30/62]
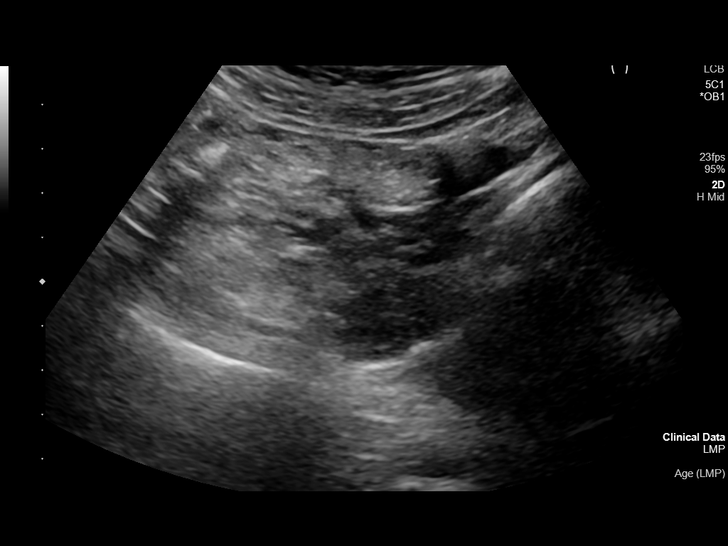
[im 34/62]
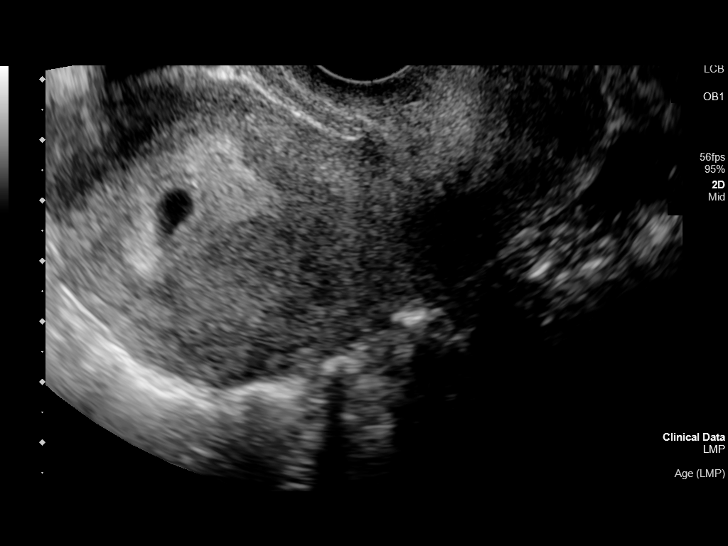
[im 39/62]
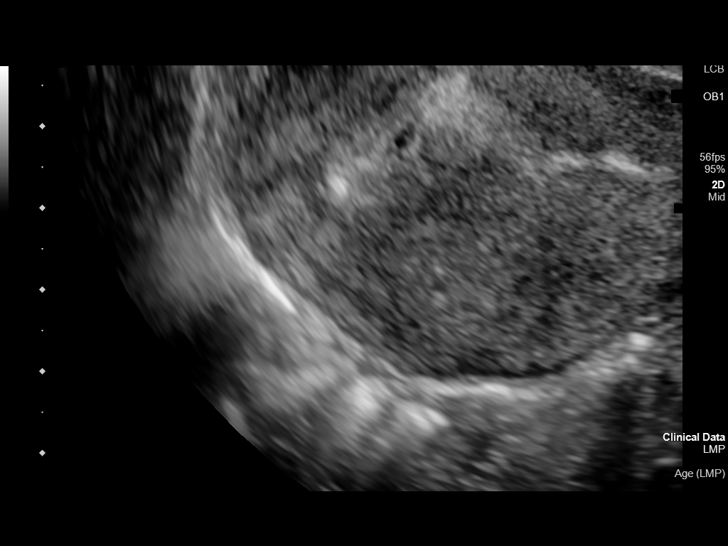
[im 43/62]
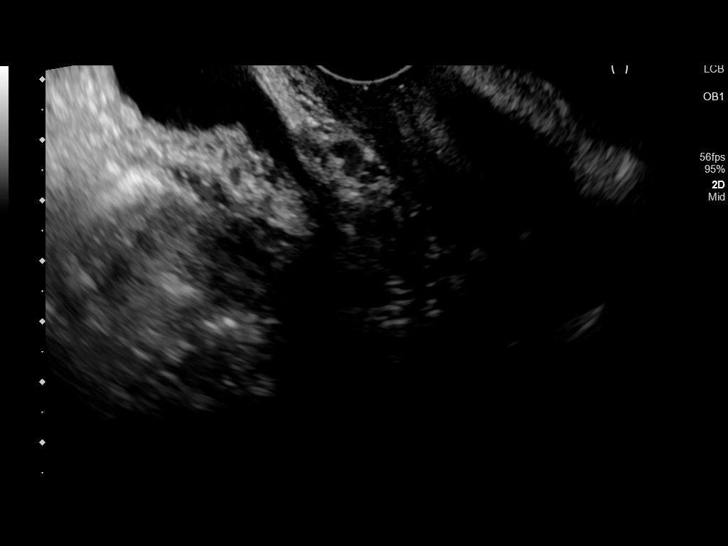
[im 48/62]
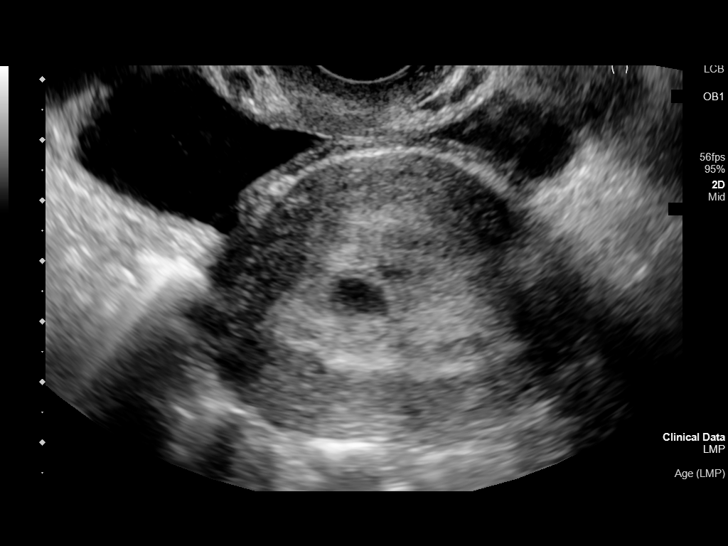
[im 52/62]
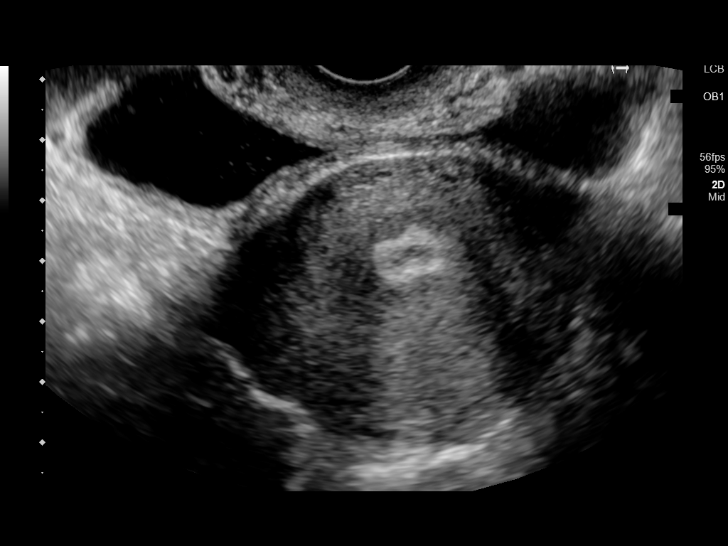
[im 57/62]
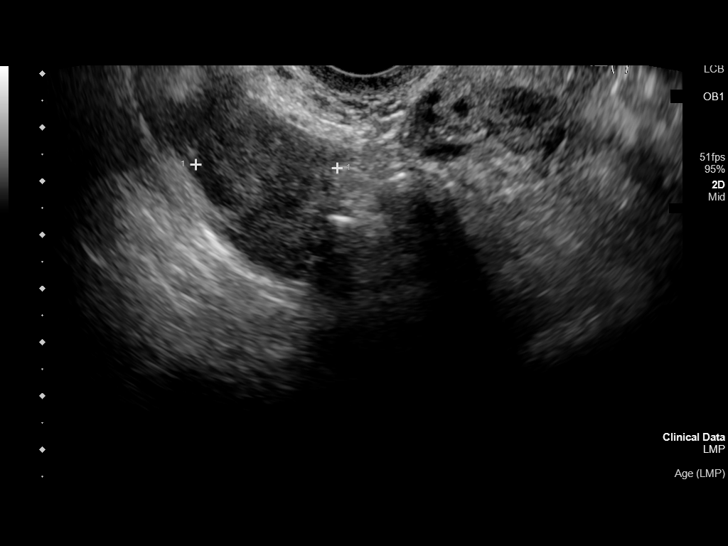
[im 62/62]
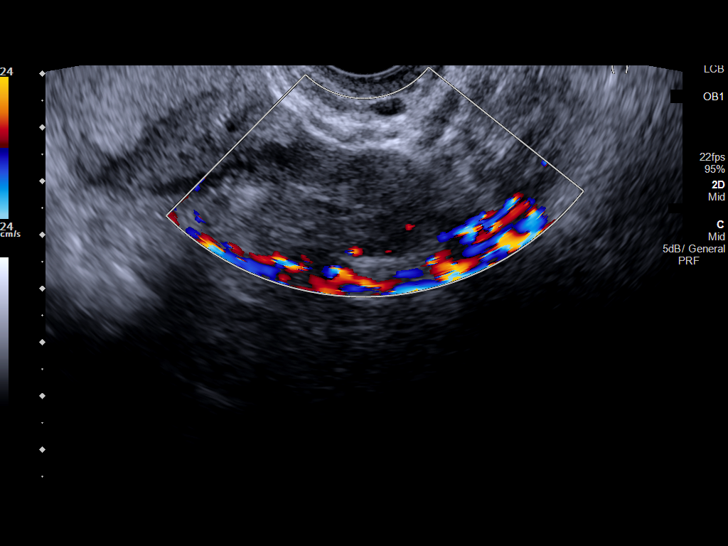

[14 of 28 positions shown; findings below may reference images not displayed]

FINDINGS: Intrauterine gestational sac: Present

Yolk sac:  Present

Embryo:  Not Visualized.

Cardiac Activity: Not Visualized.

MSD: 7.2 mm   5 w   3 d

Subchorionic hemorrhage:  None visualized.

Maternal uterus/adnexae: Normal.
IMPRESSION: Probable early intrauterine gestational sac, but no fetal pole, or
cardiac activity yet visualized. Recommend follow-up quantitative
B-HCG levels and follow-up US in 14 days to assess viability. This
recommendation follows SRU consensus guidelines: Diagnostic Criteria
for Nonviable Pregnancy Early in the First Trimester. N Engl J Med

## 2019-01-20 LAB — HM HIV SCREENING LAB: HM HIV Screening: NEGATIVE

## 2019-03-31 DIAGNOSIS — Z8619 Personal history of other infectious and parasitic diseases: Secondary | ICD-10-CM | POA: Insufficient documentation

## 2019-05-15 DIAGNOSIS — Z8742 Personal history of other diseases of the female genital tract: Secondary | ICD-10-CM

## 2019-05-15 DIAGNOSIS — F4320 Adjustment disorder, unspecified: Secondary | ICD-10-CM

## 2019-05-16 ENCOUNTER — Ambulatory Visit: Payer: Medicaid Other | Admitting: Nurse Practitioner

## 2019-05-16 ENCOUNTER — Other Ambulatory Visit: Payer: Self-pay

## 2019-05-16 ENCOUNTER — Encounter: Payer: Self-pay | Admitting: Nurse Practitioner

## 2019-05-16 DIAGNOSIS — A599 Trichomoniasis, unspecified: Secondary | ICD-10-CM

## 2019-05-16 LAB — WET PREP FOR TRICH, YEAST, CLUE
Trichomonas Exam: POSITIVE — AB
Yeast Exam: NEGATIVE

## 2019-05-16 MED ORDER — METRONIDAZOLE 500 MG PO TABS: 500.0000 mg | ORAL_TABLET | Freq: Once | ORAL | 0 refills | Status: AC

## 2019-05-16 NOTE — Progress Notes (Signed)
Family Planning Visit- Initial Visit  Subjective:  Pamela Mosley is a 28 y.o. being seen today for an initial well woman visit/6 week postpartum visit and to discuss family planning options.  She is currently using none for pregnancy prevention. Patient reports she does not if she or her partner wants a pregnancy in the next year.  Patient has the following medical conditions has HSV-2 seropositive; Scoliosis; Adjustment disorder; and History of abnormal cervical Pap smear on their problem list.  Chief Complaint  Patient presents with  . Follow-up    PP exam    Patient reports - no sexual activity since delivery of baby girl  Patient denies any additional questions or concerns at this time  Body mass index is 20.78 kg/m. - Patient is eligible for diabetes screening based on BMI and age >17?  no HA1C ordered? not applicable  Patient reports 1 of partners in last year. Desires STI screening?  No - however, thinks she may have BV  Does the patient have a current or past history of drug use? Yes - past hx of MJ use   No components found for: HCV]   There are no preventive care reminders to display for this patient.  ROS  The following portions of the patient's history were reviewed and updated as appropriate: allergies, current medications, past family history, past medical history, past social history, past surgical history and problem list. Problem list updated.   See flowsheet for other program required questions.  Objective:   Vitals:   05/16/19 1107  BP: 114/76  Weight: 113 lb 9.6 oz (51.5 kg)  Height: 5\' 2"  (1.575 m)    Physical Exam Vitals signs and nursing note reviewed.  Constitutional:      Appearance: Normal appearance.  HENT:     Mouth/Throat:     Pharynx: Oropharynx is clear.  Neck:     Musculoskeletal: Normal range of motion and neck supple.  Cardiovascular:     Rate and Rhythm: Normal rate and regular rhythm.     Heart sounds: Normal heart  sounds.  Pulmonary:     Effort: Pulmonary effort is normal.     Breath sounds: Normal breath sounds.  Neurological:     Mental Status: She is alert and oriented to person, place, and time.  Psychiatric:        Behavior: Behavior is cooperative.       Assessment and Plan:  Pamela Mosley is a 28 y.o. female presenting to the [facility] Department for a postpartum-  well woman exam/family planning visit  Contraception counseling: Reviewed all forms of birth control options available including abstinence; over the counter/barrier methods; hormonal contraceptive medication including pill, patch, ring, injection,contraceptive implant; hormonal and nonhormonal IUDs; permanent sterilization options including vasectomy and the various tubal sterilization modalities. Risks and benefits reviewed.  Questions were answered.  Written information was also given to the patient to review.  Patient desires condoms, IUD this was prescribed for patient see below.  She will follow up in 4 weeks for surveillance.  She was told to call with any further questions, or with any concerns about this method of contraception.  Emphasized use of condoms 100% of the time for STI prevention.  1. Encounter for postpartum visit Client admits to foul odor x 2 wks - denies any sexual activity post delivery Stopped bleeding and symptoms of fould discharge began Provider to review wet mount results Client collected wet mount - declines vaginal exam  - WET PREP  FOR TRICH, YEAST, CLUE - metroNIDAZOLE (FLAGYL) 500 MG tablet; Take 1 tablet (500 mg total) by mouth once for 1 dose.  Dispense: 4 tablet; Refill: 0  2. Trichimoniasis Please treat for Trich per standing order - metroNIDAZOLE (FLAGYL) 500 MG tablet; Take 1 tablet (500 mg total) by mouth once for 1 dose.  Dispense: 4 tablet; Refill: 0 [facility] - advised client to notify partner of Trich dx and treatment  needed  Client verbalizes understanding and is in agreement with plan of care    No follow-ups on file.  No future appointments.  Donn PieriniKarla W Leath, NP

## 2019-05-16 NOTE — Progress Notes (Signed)
Patient here for PP exam.Una Yeomans Wynelle Beckmann, RN

## 2019-05-16 NOTE — Progress Notes (Signed)
Wet mount reviewed by Jerline Pain FNP-BC. Client treated for trich per standing order per Ms. Leath. Shona Needles, RN

## 2019-05-17 ENCOUNTER — Encounter: Payer: Self-pay | Admitting: Nurse Practitioner

## 2019-07-25 ENCOUNTER — Encounter: Payer: Self-pay | Admitting: Advanced Practice Midwife

## 2019-07-25 DIAGNOSIS — F17200 Nicotine dependence, unspecified, uncomplicated: Secondary | ICD-10-CM | POA: Insufficient documentation

## 2020-01-09 IMAGING — US US OB < 14 WEEKS - US OB TV
1 series · 13 of 28 positions shown · non-contrast
Comparison: Pelvic ultrasound performed 08/19/2018

CLINICAL DATA: Acute onset of vaginal bleeding.

EXAM:
OBSTETRIC <14 WK US AND TRANSVAGINAL OB US
TECHNIQUE: Both transabdominal and transvaginal ultrasound examinations were
performed for complete evaluation of the gestation as well as the
maternal uterus, adnexal regions, and pelvic cul-de-sac.
Transvaginal technique was performed to assess early pregnancy.

[Series 1: us ob < 14 weeks - us ob tv · 0.17mm/px · 13 of 112 slices shown]
[im 5/112]
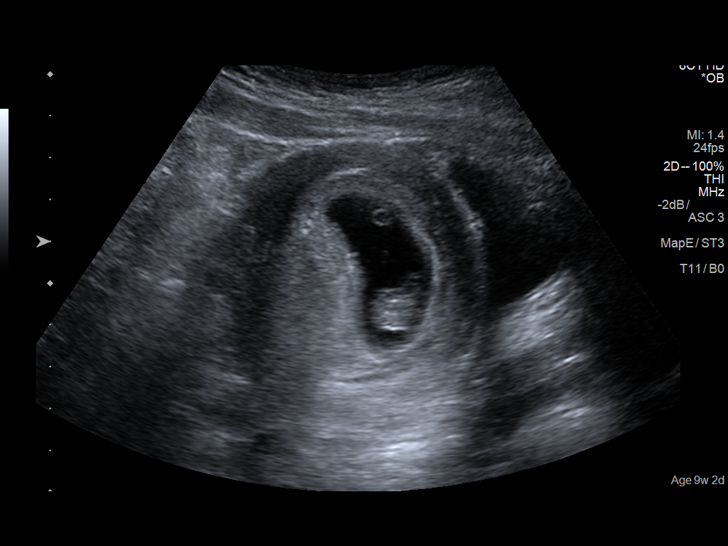
[im 13/112]
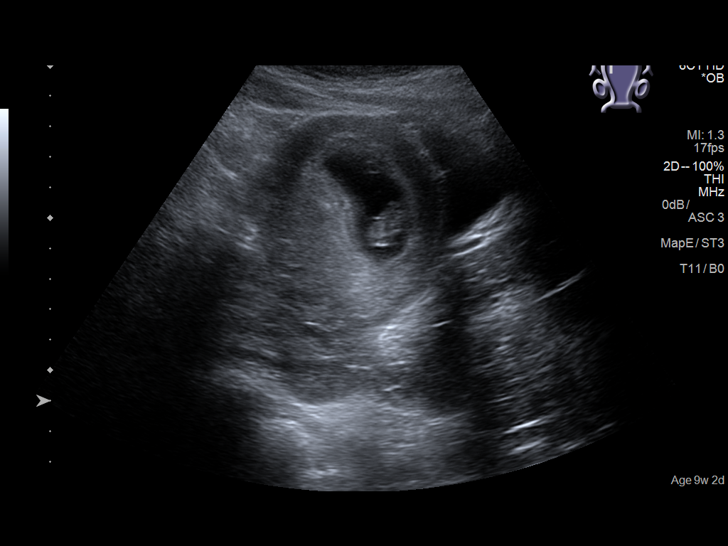
[im 21/112]
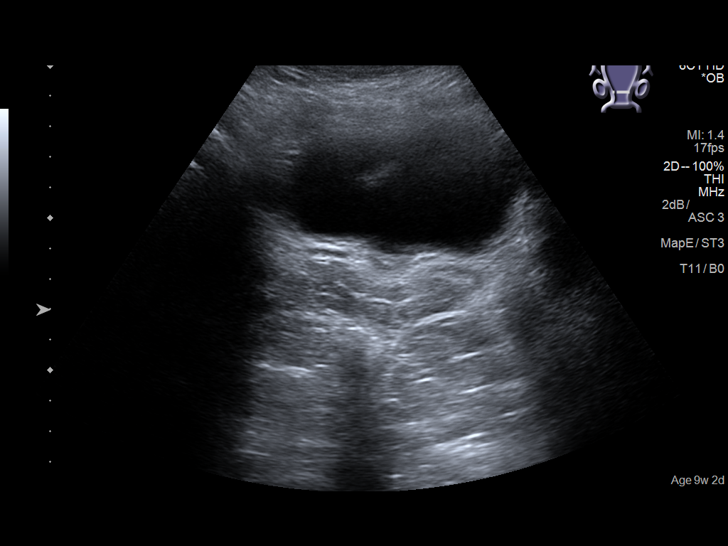
[im 29/112]
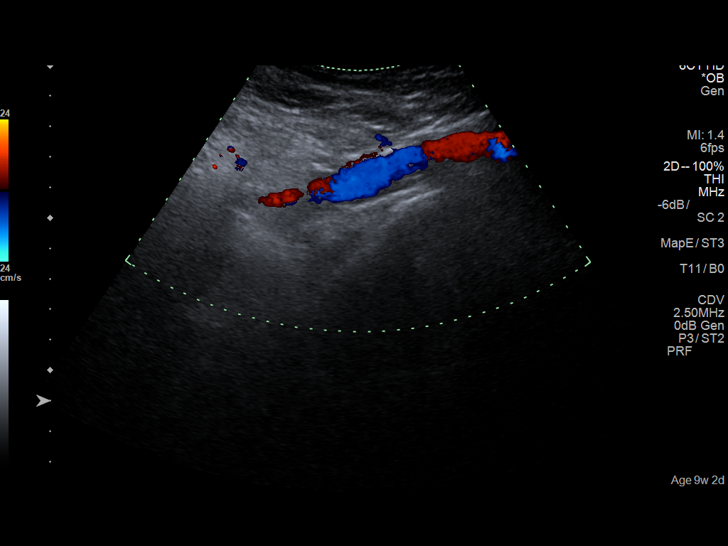
[im 38/112]
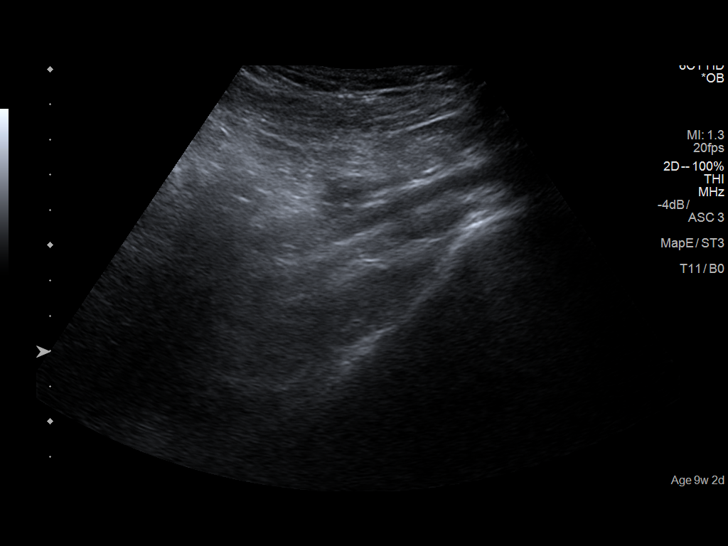
[im 46/112]
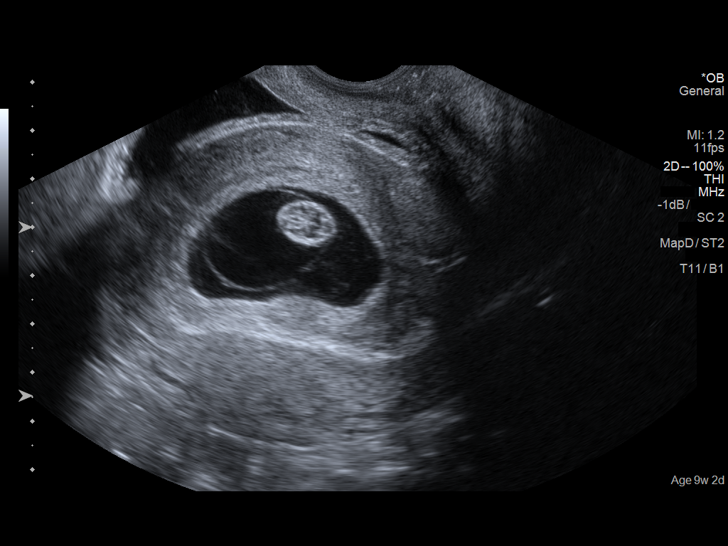
[im 58/112]
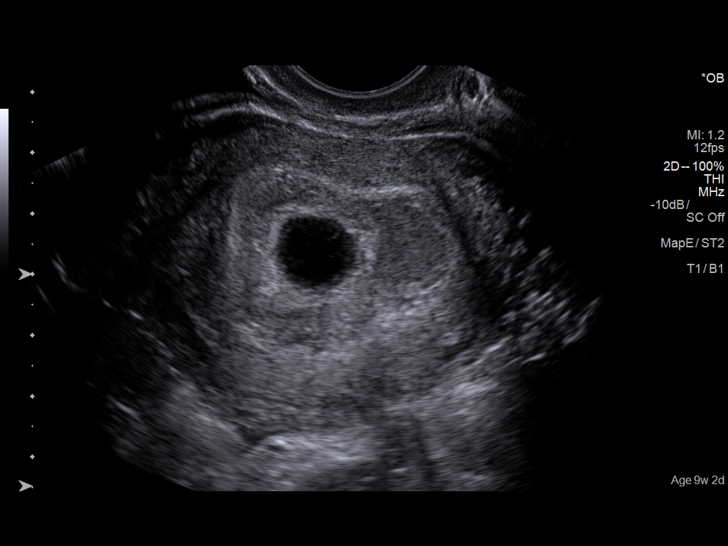
[im 66/112]
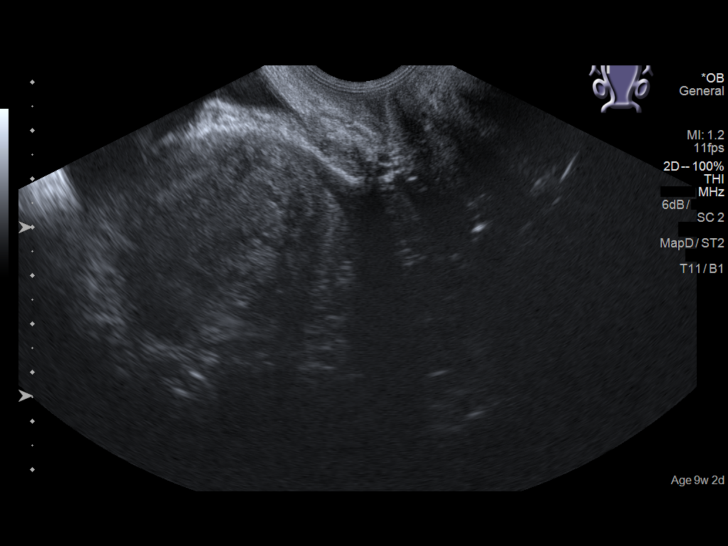
[im 75/112]
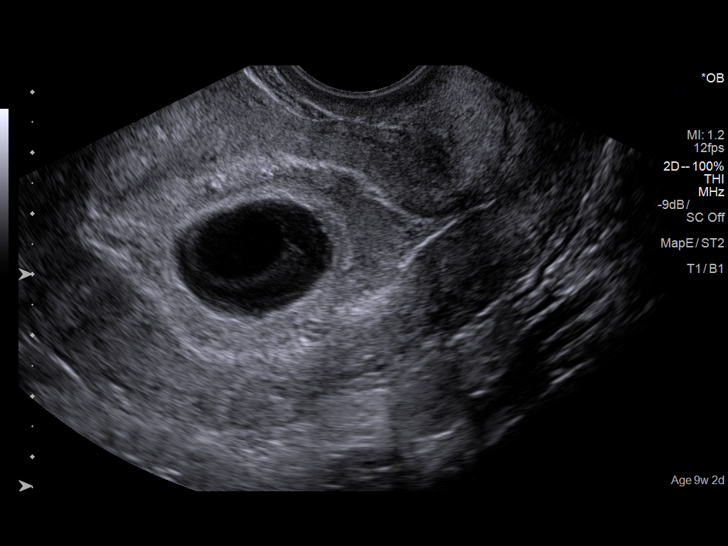
[im 83/112]
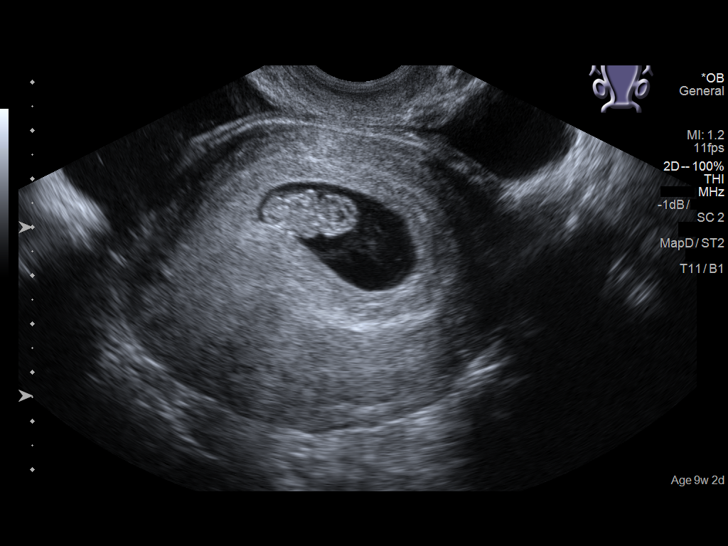
[im 91/112]
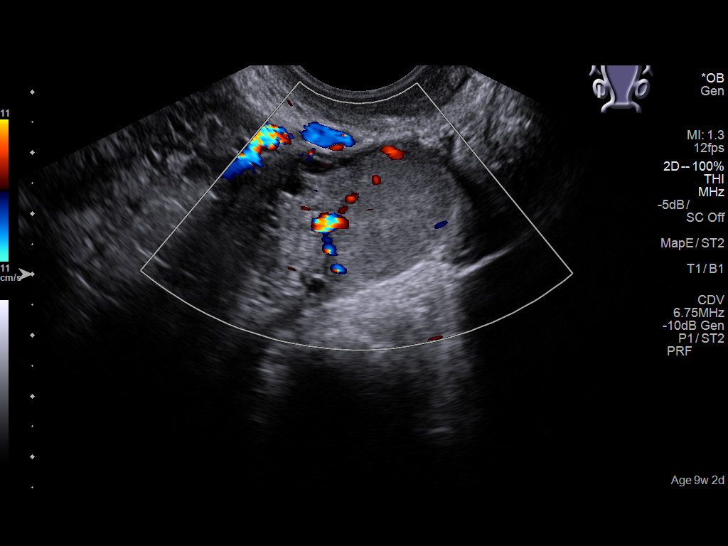
[im 99/112]
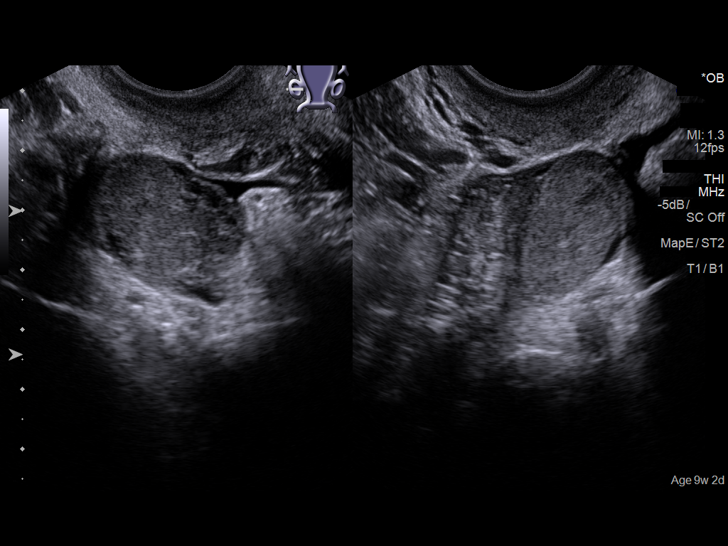
[im 107/112]
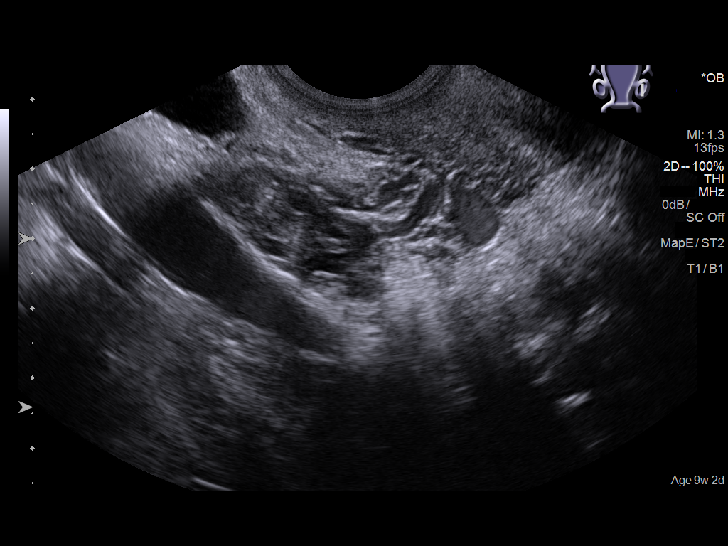

[13 of 28 positions shown; findings below may reference images not displayed]

FINDINGS: Intrauterine gestational sac: Single; visualized and normal in
shape.

Yolk sac:  Yes

Embryo:  Yes

Cardiac Activity: Yes

Heart Rate: 175 bpm

CRL: 21.8  mm   8 w   6 d                  US EDC: 04/06/2019

Subchorionic hemorrhage: A moderate amount of subchorionic
hemorrhage is noted.

Maternal uterus/adnexae: The uterus is otherwise unremarkable in
appearance. The ovaries are within normal limits. There is no
evidence for ovarian torsion. No suspicious adnexal masses are seen.

Trace free fluid is seen within the pelvic cul-de-sac.
IMPRESSION: 1. Single live intrauterine pregnancy, with a crown-rump length of
2.2 cm, corresponding to a gestational age of 8 weeks 6 days. This
matches the gestational age of 9 weeks 2 days by LMP, reflecting an
estimated date of delivery April 03, 2019.
2. Moderate amount of subchorionic hemorrhage noted.

## 2020-05-20 ENCOUNTER — Telehealth: Payer: Self-pay | Admitting: Licensed Clinical Social Worker

## 2020-05-20 NOTE — Telephone Encounter (Signed)
Patient referred by Ansel Bong, MSW due to having to terminate her pregnancy due to fetal abnormalities and current need for mental health services.

## 2020-05-20 NOTE — Telephone Encounter (Signed)
-----   Message from Ellison Carwin sent at 05/15/2020  9:04 AM EDT ----- Regarding: referral Morning Ma'am. Hope all is well.  This member recently had to terminate her pregnancy due to fetal abnormalities and needs mental health services.  At your earliest convenience would you please reach out to her? Thanks,

## 2021-01-03 ENCOUNTER — Ambulatory Visit
Admission: EM | Admit: 2021-01-03 | Discharge: 2021-01-03 | Disposition: A | Discharge status: Home / Self Care | Payer: Medicaid Other | Attending: Physician Assistant | Admitting: Physician Assistant

## 2021-01-03 ENCOUNTER — Other Ambulatory Visit: Payer: Self-pay

## 2021-01-03 ENCOUNTER — Encounter: Payer: Self-pay | Admitting: Emergency Medicine

## 2021-01-03 DIAGNOSIS — B9689 Other specified bacterial agents as the cause of diseases classified elsewhere: Secondary | ICD-10-CM | POA: Diagnosis present

## 2021-01-03 DIAGNOSIS — Z113 Encounter for screening for infections with a predominantly sexual mode of transmission: Secondary | ICD-10-CM | POA: Diagnosis not present

## 2021-01-03 DIAGNOSIS — N760 Acute vaginitis: Secondary | ICD-10-CM

## 2021-01-03 DIAGNOSIS — B009 Herpesviral infection, unspecified: Secondary | ICD-10-CM

## 2021-01-03 DIAGNOSIS — R350 Frequency of micturition: Secondary | ICD-10-CM | POA: Diagnosis present

## 2021-01-03 DIAGNOSIS — R590 Localized enlarged lymph nodes: Secondary | ICD-10-CM

## 2021-01-03 LAB — URINALYSIS, COMPLETE (UACMP) WITH MICROSCOPIC
Bilirubin Urine: NEGATIVE
Glucose, UA: NEGATIVE mg/dL
Hgb urine dipstick: NEGATIVE
Ketones, ur: NEGATIVE mg/dL
Leukocytes,Ua: NEGATIVE
Nitrite: NEGATIVE
Protein, ur: NEGATIVE mg/dL
RBC / HPF: NONE SEEN RBC/hpf (ref 0–5)
Specific Gravity, Urine: 1.02 (ref 1.005–1.030)
pH: 7.5 (ref 5.0–8.0)

## 2021-01-03 LAB — CHLAMYDIA/NGC RT PCR (ARMC ONLY)
Chlamydia Tr: NOT DETECTED
N gonorrhoeae: NOT DETECTED

## 2021-01-03 LAB — WET PREP, GENITAL
Sperm: NONE SEEN
Trich, Wet Prep: NONE SEEN
Yeast Wet Prep HPF POC: NONE SEEN

## 2021-01-03 LAB — HIV ANTIBODY (ROUTINE TESTING W REFLEX): HIV Screen 4th Generation wRfx: NONREACTIVE

## 2021-01-03 MED ORDER — METRONIDAZOLE 500 MG PO TABS: 500.0000 mg | ORAL_TABLET | Freq: Two times a day (BID) | ORAL | 0 refills | Status: AC

## 2021-01-03 MED ORDER — VALACYCLOVIR HCL 1 G PO TABS: 1000.0000 mg | ORAL_TABLET | Freq: Two times a day (BID) | ORAL | 0 refills | Status: AC

## 2021-01-03 NOTE — Discharge Instructions (Addendum)
Your buttocks ulceration is consistent with a herpes outbreak.  I have sent in Valtrex to help prevent new lesions from appearing.  The lump you feel in your right groin area is consistent with a enlarged lymph node, likely in response to the herpes.  It should go down in size over the next couple weeks as the infection is treated.  A vaginal swab shows that you have bacterial vaginosis. Take metronidazole antibiotic to treat this.   Results of your gonorrhea, chlamydia, syphilis and HIV test will be on your MyChart tomorrow or the next day.  We will call if something is positive. Always practice safe sex.  For some reason the lump in your groin region is not gone after 2 weeks you should be seen again.  Consider following up with PCP, gynecologist, or our department.

## 2021-01-03 NOTE — ED Triage Notes (Signed)
Patient in today stating she felt a "marble sized" lump in her right groin yesterday. She states it is tender to the touch. Patient also c/o a rash at the top of her buttocks since Monday (12/30/20). Patient states she does have HSV 2, but has never had an outbreak. Patient has been applying OTC Neosporin to the rash. Patient states the rash is starting to dry up.

## 2021-01-03 NOTE — ED Provider Notes (Signed)
[facility]    CSN: [id number] Arrival date & time: 01/03/21  1220      History   Chief Complaint Chief Complaint  Patient presents with  . Mass    Right groin  . Rash    buttock    HPI Pamela Mosley is a 30 y.o. female presenting for concerns about a "marble sized lump" of the right groin region since yesterday.  Patient says the area is tender to touch.  She denies any lumps or bumps anywhere else.  She does report a painful rash of the right inner buttocks for 3 to 4 days.  Patient says she did test positive for HSV-2 through blood test, but has never had an outbreak.  She says she has been applying Neosporin to the rash and it is starting to heal up.  Patient does report some urinary frequency but denies any urgency or painful urination.  She denies any abnormal vaginal discharge, pelvic pain, vaginal itching or odor.  No vaginal lesions.  Patient has a new sexual partner as of 3 months ago and does request STI work-up today.  She has no other complaints or concerns.  HPI  Past Medical History:  Diagnosis Date  . Miscarriage 2009; 2014, 2021   NO SURGERY  . Scoliosis     Patient Active Problem List   Diagnosis Date Noted  . Smoker 07/25/2019  . Adjustment disorder 11/29/2018  . 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  . Scoliosis 09/05/2018  . History of abnormal cervical Pap smear 09/01/2018    Past Surgical History:  Procedure Laterality Date  . BACK SURGERY     for scoliosis  . DILATION AND CURETTAGE OF UTERUS    . WISDOM TOOTH EXTRACTION  2012   all four    OB History    Gravida  4   Para  1   Term  1   Preterm      AB  2   Living  1     SAB  2   IAB      Ectopic      Multiple      Live Births  1            Home Medications    Prior to Admission medications   Medication Sig Start Date End Date Taking? Authorizing Provider  levonorgestrel (MIRENA) 20 MCG/24HR IUD 1 each by Intrauterine route once.   Yes [provider]  metroNIDAZOLE (FLAGYL) 500 MG tablet Take 1 tablet (500 mg total) by mouth 2 (two) times daily for 7 days. 01/03/21 01/10/21 Yes Shirlee Latch, PA-C  valACYclovir (VALTREX) 1000 MG tablet Take 1 tablet (1,000 mg total) by mouth 2 (two) times daily for 7 days. 01/03/21 01/10/21 Yes Shirlee Latch, PA-C  Prenatal Vit-Fe Fumarate-FA (MULTIVITAMIN-PRENATAL) 27-0.8 MG TABS tablet Take 1 tablet by mouth daily at 12 noon.    [provider]    Family History Family History  Problem Relation Age of Onset  . Hypertension Mother   . Kidney disease Mother   . Stroke Mother   . Alcohol abuse Mother   . Drug abuse Mother   . Osteoarthritis Mother   . Aneurysm Mother   . Migraines Mother   . Drug abuse Father   . Cancer Father   . Hyperlipidemia Father   . Hypertension Father   . Gestational diabetes Sister   . Heart attack Maternal Grandmother   . Heart murmur Maternal Grandmother  Social History Social History   Tobacco Use  . Smoking status: Current Every Day Smoker    Years: 0.00    Types: Cigars  . Smokeless tobacco: Never Used  . Tobacco comment: 1 black and mild daily  Vaping Use  . Vaping Use: Never used  Substance Use Topics  . Alcohol use: Yes    Comment: socially  . Drug use: Yes    Frequency: 2.0 times per week    Types: Marijuana     Allergies   Latex   Review of Systems Review of Systems  Constitutional: Negative for fatigue and fever.  Gastrointestinal: Negative for abdominal pain, nausea and vomiting.  Genitourinary: Positive for frequency. Negative for dysuria, pelvic pain, vaginal discharge and vaginal pain.  Musculoskeletal: Negative for back pain.  Skin: Positive for rash.  Neurological: Negative for weakness.     Physical Exam Triage Vital Signs ED Triage Vitals  Enc Vitals Group     BP 01/03/21 1231 112/79     Pulse Rate 01/03/21 1231 84     Resp 01/03/21 1231 18     Temp 01/03/21 1231 98.5 F (36.9 C)     Temp  Source 01/03/21 1231 Oral     SpO2 01/03/21 1231 100 %     Weight 01/03/21 1232 118 lb (53.5 kg)     Height 01/03/21 1232 5\' 2"  (1.575 m)     Head Circumference --      Peak Flow --      Pain Score 01/03/21 1231 3     Pain Loc --      Pain Edu? --      Excl. in GC? --    No data found.  Updated Vital Signs BP 112/79 (BP Location: Left Arm)   Pulse 84   Temp 98.5 F (36.9 C) (Oral)   Resp 18   Ht 5\' 2"  (1.575 m)   Wt 118 lb (53.5 kg)   SpO2 100%   BMI 21.58 kg/m       Physical Exam Vitals and nursing note reviewed.  Constitutional:      General: She is not in acute distress.    Appearance: Normal appearance. She is not ill-appearing or toxic-appearing.  HENT:     Head: Normocephalic and atraumatic.  Eyes:     General: No scleral icterus.       Right eye: No discharge.        Left eye: No discharge.     Conjunctiva/sclera: Conjunctivae normal.  Cardiovascular:     Rate and Rhythm: Normal rate and regular rhythm.     Heart sounds: Normal heart sounds.  Pulmonary:     Effort: Pulmonary effort is normal. No respiratory distress.     Breath sounds: Normal breath sounds.  Abdominal:     Palpations: Abdomen is soft.     Tenderness: There is no abdominal tenderness.  Musculoskeletal:     Cervical back: Neck supple.  Lymphadenopathy:     Lower Body: Right inguinal adenopathy (small mobile enlarged lymph node that is tender) present.  Skin:    General: Skin is dry.     Findings: Rash (shallow ulcerations right gluteal cleft, TTP) present.  Neurological:     General: No focal deficit present.     Mental Status: She is alert. Mental status is at baseline.     Motor: No weakness.     Gait: Gait normal.  Psychiatric:        Mood and Affect: Mood normal.  Behavior: Behavior normal.        Thought Content: Thought content normal.      UC Treatments / Results  Labs (all labs ordered are listed, but only abnormal results are displayed) Labs Reviewed  WET  PREP, GENITAL - Abnormal; Notable for the following components:      Result Value   Clue Cells Wet Prep HPF POC PRESENT (*)    WBC, Wet Prep HPF POC FEW (*)    All other components within normal limits  URINALYSIS, COMPLETE (UACMP) WITH MICROSCOPIC - Abnormal; Notable for the following components:   APPearance HAZY (*)    Bacteria, UA MANY (*)    All other components within normal limits  CHLAMYDIA/NGC RT PCR ([facility] ONLY)  HIV ANTIBODY (ROUTINE TESTING W REFLEX)  RPR    EKG   Radiology No results found.  Procedures Procedures (including critical care time)  Medications Ordered in UC Medications - No data to display  Initial Impression / Assessment and Plan / UC Course  I have reviewed the triage vital signs and the nursing notes.  Pertinent labs & imaging results that were available during my care of the patient were reviewed by me and considered in my medical decision making (see chart for details).   30 year old female presenting for lump of the groin region which is consistent with an enlarged lymph node of the right inguinal area.  She does have a rash of the right buttocks.  These are small ulcerations which do appear to be consistent with herpes type rash.  Treating with Valtrex at this time.  Advised supportive care.  Advised patient that the lymphadenopathy should resolve as the infection clears.  Advised that if it does not within a couple of weeks then she should be seen again.  Urinalysis obtained today due to patient complaint of urinary frequency. UA normal.   Wet prep, GC/chlamydia, RPR, and HIV testing obtained today at patient request. Wet prep shows +clue cells. Treating with metronidazole.   Advised patient how to access results of STI testing.  Advised if any thing is positive she will be called and notified and may need to return for treatment.  Advised always practice safe sex.  Final Clinical Impressions(s) / UC Diagnoses   Final diagnoses:   Herpes infection  Inguinal lymphadenopathy  Screening examination for sexually transmitted disease  Urinary frequency  Bacterial vaginitis     Discharge Instructions     Your buttocks ulceration is consistent with a herpes outbreak.  I have sent in Valtrex to help prevent new lesions from appearing.  The lump you feel in your right groin area is consistent with a enlarged lymph node, likely in response to the herpes.  It should go down in size over the next couple weeks as the infection is treated.  A vaginal swab shows that you have bacterial vaginosis. Take metronidazole antibiotic to treat this.   Results of your gonorrhea, chlamydia, syphilis and HIV test will be on your MyChart tomorrow or the next day.  We will call if something is positive. Always practice safe sex.  For some reason the lump in your groin region is not gone after 2 weeks you should be seen again.  Consider following up with PCP, gynecologist, or our department.    ED Prescriptions    Medication Sig Dispense Mosley. Provider   valACYclovir (VALTREX) 1000 MG tablet Take 1 tablet (1,000 mg total) by mouth 2 (two) times daily for 7 days. 14 tablet Eusebio Friendly  B, PA-C   metroNIDAZOLE (FLAGYL) 500 MG tablet Take 1 tablet (500 mg total) by mouth 2 (two) times daily for 7 days. 14 tablet Gareth Morgan     PDMP not reviewed this encounter.   Shirlee Latch, PA-C 01/03/21 1324

## 2021-01-04 LAB — RPR: RPR Ser Ql: NONREACTIVE

## 2021-09-15 ENCOUNTER — Other Ambulatory Visit: Payer: Self-pay

## 2021-09-15 ENCOUNTER — Ambulatory Visit
Admission: EM | Admit: 2021-09-15 | Discharge: 2021-09-15 | Disposition: A | Discharge status: Home / Self Care | Payer: Medicaid Other | Attending: Emergency Medicine | Admitting: Emergency Medicine

## 2021-09-15 DIAGNOSIS — J111 Influenza due to unidentified influenza virus with other respiratory manifestations: Secondary | ICD-10-CM | POA: Diagnosis not present

## 2021-09-15 MED ORDER — IBUPROFEN 800 MG PO TABS: 800.0000 mg | ORAL_TABLET | Freq: Three times a day (TID) | ORAL | 0 refills | Status: DC

## 2021-09-15 MED ORDER — CYCLOBENZAPRINE HCL 10 MG PO TABS: 10.0000 mg | ORAL_TABLET | Freq: Two times a day (BID) | ORAL | 0 refills | Status: DC | PRN

## 2021-09-15 NOTE — ED Triage Notes (Signed)
Pt here with C/O body aches, headache and cough for 4 days had fever for 3 days, none today.

## 2021-09-15 NOTE — Discharge Instructions (Addendum)
Your symptoms today are consistent with the current presentation of the flu therefore you will be treated as such, unfortunately because you have been sick for 4 days we are unable to use antiviral medications, we will not formally test you today as we are running out of test and a reserving those for people who are considered high risk, due to your age and your overall general well health you are not considered to be in that group  Today's treatment we will aim at managing your symptoms and making them a little bit more tolerable into the virus resolves on its own  You may use Flexeril twice a day as needed to help with body aches, be mindful this medication may make you drowsy, take first dose at home to see how your body reacts to it  You may use ibuprofen every 8 hours as needed to help with body aches in addition to Tylenol    You can take Tylenol and/or Ibuprofen as needed for fever reduction and pain relief.   For cough: honey 1/2 to 1 teaspoon (you can dilute the honey in water or another fluid).  You can also use guaifenesin and dextromethorphan for cough. You can use a humidifier for chest congestion and cough.  If you don't have a humidifier, you can sit in the bathroom with the hot shower running.      For sore throat: try warm salt water gargles, cepacol lozenges, throat spray, warm tea or water with lemon/honey, popsicles or ice, or OTC cold relief medicine for throat discomfort.   For congestion: take a daily anti-histamine like Zyrtec, Claritin, and a oral decongestant, such as pseudoephedrine.  You can also use Flonase 1-2 sprays in each nostril daily.   It is important to stay hydrated: drink plenty of fluids (water, gatorade/powerade/pedialyte, juices, or teas) to keep your throat moisturized and help further relieve irritation/discomfort.

## 2021-09-15 NOTE — ED Provider Notes (Signed)
[facility]    CSN: [id number] Arrival date & time: 09/15/21  1601      History   Chief Complaint Chief Complaint  Patient presents with   Cough    HPI Pamela Mosley is a 30 y.o. female.   Patient presents with fever, chills, nasal congestion, rhinorrhea, sore throat, nonproductive cough, nausea without vomiting, lower back pain and intermittent generalized headaches for 4 days . has attempted cold and flu medications, somewhat helpful. No known sick contacts. No pertinent medical history. Former smoker.   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 Authorizing Provider  levonorgestrel (MIRENA) 20 MCG/24HR IUD 1 each by Intrauterine route once.    [provider]  Prenatal Vit-Fe Fumarate-FA (MULTIVITAMIN-PRENATAL) 27-0.8 MG TABS tablet Take 1 tablet by mouth daily at 12 noon.    [provider]    Family History Family History  Problem Relation Age of Onset   Hypertension Mother    Kidney disease Mother    Stroke Mother    Alcohol abuse Mother    Drug abuse Mother    Osteoarthritis Mother    Aneurysm Mother    Migraines Mother    Drug abuse Father    Cancer Father    Hyperlipidemia Father    Hypertension Father     Gestational diabetes Sister    Heart attack Maternal Grandmother    Heart murmur Maternal Grandmother     Social History Social History   Tobacco Use   Smoking status: Every Day    Types: Cigars   Smokeless tobacco: Never   Tobacco comments:    1 black and mild daily  Vaping Use   Vaping Use: Never used  Substance Use Topics   Alcohol use: Yes    Comment: socially   Drug use: Yes    Frequency: 2.0 times per week    Types: Marijuana     Allergies   Latex   Review of Systems Review of Systems  Constitutional:  Positive for chills and fever. Negative for activity change, appetite change, diaphoresis, fatigue and unexpected weight change.  HENT:  Positive for congestion, rhinorrhea and sore throat. Negative for dental problem, drooling, ear discharge, ear pain, facial swelling, hearing loss, mouth sores, nosebleeds, postnasal drip, sinus pressure, sinus pain, sneezing, tinnitus, trouble swallowing and voice change.   Respiratory:  Positive for cough. Negative for apnea, choking, chest  tightness, shortness of breath, wheezing and stridor.   Cardiovascular: Negative.   Gastrointestinal:  Positive for nausea. Negative for abdominal distention, abdominal pain, anal bleeding, blood in stool, constipation, diarrhea, rectal pain and vomiting.  Musculoskeletal:  Positive for back pain. Negative for arthralgias, gait problem, joint swelling, myalgias, neck pain and neck stiffness.  Skin: Negative.   Neurological:  Positive for headaches. Negative for dizziness, tremors, seizures, syncope, facial asymmetry, speech difficulty, weakness, light-headedness and numbness.    Physical Exam Triage Vital Signs ED Triage Vitals  Enc Vitals Group     BP 09/15/21 1648 113/77     Pulse Rate 09/15/21 1648 80     Resp 09/15/21 1648 18     Temp 09/15/21 1648 99 F (37.2 C)     Temp Source 09/15/21 1648 Oral     SpO2 09/15/21 1648 95 %     Weight 09/15/21 1647 110 lb (49.9 kg)     Height  09/15/21 1647 5\' 2"  (1.575 m)     Head Circumference --      Peak Flow --      Pain Score 09/15/21 1646 6     Pain Loc --      Pain Edu? --      Excl. in [place]? --    No data found.  Updated Vital Signs BP 113/77 (BP Location: Right Arm)   Pulse 80   Temp 99 F (37.2 C) (Oral)   Resp 18   Ht 5\' 2"  (1.575 m)   Wt 110 lb (49.9 kg)   LMP 09/14/2021   SpO2 95%   BMI 20.12 kg/m   Visual Acuity Right Eye Distance:   Left Eye Distance:   Bilateral Distance:    Right Eye Near:   Left Eye Near:    Bilateral Near:     Physical Exam Constitutional:      Appearance: Normal appearance. She is normal weight.  HENT:     Head: Normocephalic.     Right Ear: Tympanic membrane, ear canal and external ear normal.     Left Ear: Tympanic membrane, ear canal and external ear normal.     Nose: Congestion and rhinorrhea present.     Mouth/Throat:     Mouth: Mucous membranes are moist.     Pharynx: Posterior oropharyngeal erythema present.  Eyes:     Extraocular Movements: Extraocular movements intact.  Cardiovascular:     Rate and Rhythm: Normal rate and regular rhythm.     Pulses: Normal pulses.     Heart sounds: Normal heart sounds.  Pulmonary:     Effort: Pulmonary effort is normal.     Breath sounds: Normal breath sounds.  Musculoskeletal:     Cervical back: Normal range of motion and neck supple.  Skin:    General: Skin is warm and dry.  Neurological:     Mental Status: She is alert and oriented to person, place, and time. Mental status is at baseline.  Psychiatric:        Mood and Affect: Mood normal.        Behavior: Behavior normal.     UC Treatments / Results  Labs (all labs ordered are listed, but only abnormal results are displayed) Labs Reviewed - No data to display  EKG   Radiology No results found.  Procedures Procedures (including critical care time)  Medications Ordered in UC Medications - No data to display  Initial Impression / Assessment and  Plan / UC Course  I have  reviewed the triage vital signs and the nursing notes.  Pertinent labs & imaging results that were available during my care of the patient were reviewed by me and considered in my medical decision making (see chart for details).  Influenza-like illness  1.  Flexeril 10 mg twice daily as needed 2.  Ibuprofen 800 mg 3 times daily as needed 3.  Over-the-counter medications for remaining symptom management 4.  Work note given 5.  Urgent care follow-up as needed Final Clinical Impressions(s) / UC Diagnoses   Final diagnoses:  None   Discharge Instructions   None    ED Prescriptions   None    PDMP not reviewed this encounter.   Valinda Hoar, NP 09/15/21 1708

## 2021-10-19 NOTE — L&D Delivery Note (Signed)
     Delivery Note   Pamela Mosley is a 31 y.o. [id number] at [redacted]w[redacted]d Estimated Date of Delivery: 08/23/22  PRE-OPERATIVE DIAGNOSIS:  1) [redacted]w[redacted]d pregnancy.    POST-OPERATIVE DIAGNOSIS:  1) [redacted]w[redacted]d pregnancy s/p Vaginal, Spontaneous    Delivery Type: Vaginal, Spontaneous    Delivery Anesthesia: Epidural   Labor Complications:  none    ESTIMATED BLOOD LOSS: 125  ml    FINDINGS:   1) female infant, Apgar scores of   8 at 1 minute and   8 at 5 minutes and a birthweight pending , infant skin to skin .    2) Nuchal cord: no  SPECIMENS:   PLACENTA:   Appearance: Intact , 3 vessel cord   Removal: Spontaneous      Disposition:  per protocol   DISPOSITION:  Infant to left in stable condition in the delivery room, with L&D personnel and mother,  NARRATIVE SUMMARY: Labor course:  Ms. Pamela Mosley is a [id number] at [redacted]w[redacted]d who presented for observation.  She progressed well in labor with pitocin.  She received the appropriate epidural anesthesia and proceeded to complete dilation. She evidenced good maternal expulsive effort during the second stage. She went on to deliver a viable female infant. The placenta delivered without problems and was noted to be complete. A perineal and vaginal examination was performed. Episiotomy/Lacerations: 1st degree;Perineal . Lacerations was repaired with 3-0 Vicryl Rapide suture.  The patient tolerated this well.  Philip Aspen, CNM  08/18/2022 8:48 AM

## 2021-12-27 ENCOUNTER — Encounter: Payer: Self-pay | Admitting: Emergency Medicine

## 2021-12-27 ENCOUNTER — Emergency Department: Payer: Medicaid Other

## 2021-12-27 ENCOUNTER — Other Ambulatory Visit: Payer: Self-pay

## 2021-12-27 ENCOUNTER — Emergency Department
Admission: EM | Admit: 2021-12-27 | Discharge: 2021-12-27 | Disposition: A | Discharge status: Home / Self Care | Payer: Medicaid Other | Attending: Emergency Medicine | Admitting: Emergency Medicine

## 2021-12-27 DIAGNOSIS — N939 Abnormal uterine and vaginal bleeding, unspecified: Secondary | ICD-10-CM

## 2021-12-27 DIAGNOSIS — Z3A01 Less than 8 weeks gestation of pregnancy: Secondary | ICD-10-CM | POA: Insufficient documentation

## 2021-12-27 DIAGNOSIS — B9689 Other specified bacterial agents as the cause of diseases classified elsewhere: Secondary | ICD-10-CM | POA: Insufficient documentation

## 2021-12-27 DIAGNOSIS — O2341 Unspecified infection of urinary tract in pregnancy, first trimester: Secondary | ICD-10-CM | POA: Insufficient documentation

## 2021-12-27 DIAGNOSIS — O23591 Infection of other part of genital tract in pregnancy, first trimester: Secondary | ICD-10-CM | POA: Insufficient documentation

## 2021-12-27 DIAGNOSIS — O209 Hemorrhage in early pregnancy, unspecified: Secondary | ICD-10-CM | POA: Insufficient documentation

## 2021-12-27 LAB — WET PREP, GENITAL
Sperm: NONE SEEN
Trich, Wet Prep: NONE SEEN
WBC, Wet Prep HPF POC: <10 (ref <10)
Yeast Wet Prep HPF POC: NONE SEEN

## 2021-12-27 LAB — CBC WITH DIFFERENTIAL/PLATELET
Abs Immature Granulocytes: 0.02 10*3/uL (ref 0.00–0.07)
Basophils Absolute: 0.1 10*3/uL (ref 0.0–0.1)
Basophils Relative: 1 %
Eosinophils Absolute: 0.1 10*3/uL (ref 0.0–0.5)
Eosinophils Relative: 1 %
HCT: 40.2 % (ref 36.0–46.0)
Hemoglobin: 13.5 g/dL (ref 12.0–15.0)
Immature Granulocytes: 0 %
Lymphocytes Relative: 27 %
Lymphs Abs: 2.1 10*3/uL (ref 0.7–4.0)
MCH: 32.2 pg (ref 26.0–34.0)
MCHC: 33.6 g/dL (ref 30.0–36.0)
MCV: 95.9 fL (ref 80.0–100.0)
Monocytes Absolute: 0.5 10*3/uL (ref 0.1–1.0)
Monocytes Relative: 6 %
Neutro Abs: 4.8 10*3/uL (ref 1.7–7.7)
Neutrophils Relative %: 65 %
Platelets: 261 10*3/uL (ref 150–400)
RBC: 4.19 MIL/uL (ref 3.87–5.11)
RDW: 11.9 % (ref 11.5–15.5)
WBC: 7.6 10*3/uL (ref 4.0–10.5)
nRBC: 0 % (ref 0.0–0.2)

## 2021-12-27 LAB — COMPREHENSIVE METABOLIC PANEL
ALT: 25 U/L (ref 0–44)
AST: 28 U/L (ref 15–41)
Albumin: 3.9 g/dL (ref 3.5–5.0)
Alkaline Phosphatase: 43 U/L (ref 38–126)
Anion gap: 7 (ref 5–15)
BUN: 5 mg/dL — ABNORMAL LOW (ref 6–20)
CO2: 25 mmol/L (ref 22–32)
Calcium: 9.3 mg/dL (ref 8.9–10.3)
Chloride: 103 mmol/L (ref 98–111)
Creatinine, Ser: 0.57 mg/dL (ref 0.44–1.00)
GFR, Estimated: >60 mL/min (ref >60)
Glucose, Bld: 75 mg/dL (ref 70–99)
Potassium: 3.9 mmol/L (ref 3.5–5.1)
Sodium: 135 mmol/L (ref 135–145)
Total Bilirubin: 0.5 mg/dL (ref 0.3–1.2)
Total Protein: 7.3 g/dL (ref 6.5–8.1)

## 2021-12-27 LAB — URINALYSIS, ROUTINE W REFLEX MICROSCOPIC
Bilirubin Urine: NEGATIVE
Glucose, UA: NEGATIVE mg/dL
Hgb urine dipstick: NEGATIVE
Ketones, ur: NEGATIVE mg/dL
Nitrite: POSITIVE — AB
Protein, ur: NEGATIVE mg/dL
Specific Gravity, Urine: 1.02 (ref 1.005–1.030)
pH: 7 (ref 5.0–8.0)

## 2021-12-27 LAB — CHLAMYDIA/NGC RT PCR (ARMC ONLY)
Chlamydia Tr: NOT DETECTED
N gonorrhoeae: NOT DETECTED

## 2021-12-27 LAB — HCG, QUANTITATIVE, PREGNANCY: hCG, Beta Chain, Quant, S: 43766 m[IU]/mL — ABNORMAL HIGH (ref <5)

## 2021-12-27 LAB — ABO/RH: ABO/RH(D): A POS

## 2021-12-27 MED ORDER — METRONIDAZOLE 500 MG PO TABS: 500.0000 mg | ORAL_TABLET | Freq: Two times a day (BID) | ORAL | 0 refills | Status: AC

## 2021-12-27 MED ORDER — CEPHALEXIN 500 MG PO CAPS: 500.0000 mg | ORAL_CAPSULE | Freq: Four times a day (QID) | ORAL | 0 refills | Status: AC

## 2021-12-27 NOTE — ED Notes (Signed)
Pt taken for U/S

## 2021-12-27 NOTE — ED Notes (Signed)
Pt given cup for urine sample 

## 2021-12-27 NOTE — ED Provider Notes (Signed)
? ?[facility] ?Provider Note ? ? ? Event Date/Time  ? First MD Initiated Contact with Patient 12/27/21 1032   ?  (approximate) ? ? ?History  ? ?Possible Pregnancy and Vaginal Bleeding ? ? ?HPI ? ?Pamela Mosley is a 31 y.o. female who comes in with concerns for being pregnant and having vaginal bleeding.  Patient reports she is unclear what her last LMP was.  Stated that it was sometime at the end of January.  She states that she had a positive hCG test that was uptrending a few days ago but today they had had sexual intercourse and shortly after she developed bright red blood.  She denies any prior ultrasound during this pregnancy.  Patient states that she is typically followed with the health department for her last pregnancies.  She had 2 live births, 1 miscarriage at 10 weeks and 1 miscarriage around 22 weeks due to abnormally developed heart.  She had to have a D&C for this a miscarriage.  She reports that the bleeding has slowed down and is pretty minimal at this time but denies any abdominal pain or cramping.  She denies any concerns for STDs. ? ?Physical Exam  ? ?Triage Vital Signs: ?ED Triage Vitals  ?Enc Vitals Group  ?   BP 12/27/21 1029 (!) 111/53  ?   Pulse Rate 12/27/21 1029 68  ?   Resp 12/27/21 1029 16  ?   Temp 12/27/21 1029 98.1 ?F (36.7 ?C)  ?   Temp Source 12/27/21 1029 Oral  ?   SpO2 12/27/21 1029 100 %  ?   Weight 12/27/21 1021 110 lb 3.7 oz (50 kg)  ?   Height 12/27/21 1021 5\' 2"  (1.575 m)  ?   Head Circumference --   ?   Peak Flow --   ?   Pain Score 12/27/21 1021 0  ?   Pain Loc --   ?   Pain Edu? --   ?   Excl. in [place]? --   ? ? ?Most recent vital signs: ?Vitals:  ? 12/27/21 1029  ?BP: (!) 111/53  ?Pulse: 68  ?Resp: 16  ?Temp: 98.1 ?F (36.7 ?C)  ?SpO2: 100%  ? ? ? ?General: Awake, no distress.  ?CV:  Good peripheral perfusion.  ?Resp:  Normal effort.  ?Abd:  No distention.  Soft nontender ?Other:   ? ? ?ED Results / Procedures / Treatments  ? ?Labs ?(all labs ordered  are listed, but only abnormal results are displayed) ?Labs Reviewed  ?CBC WITH DIFFERENTIAL/PLATELET  ?COMPREHENSIVE METABOLIC PANEL  ?HCG, QUANTITATIVE, PREGNANCY  ?URINALYSIS, ROUTINE W REFLEX MICROSCOPIC  ?POC URINE PREG, ED  ?ABO/RH  ? ? ? ? ?RADIOLOGY ?I have reviewed the [place] personally gestational sac no embyro ? ? ?PROCEDURES: ? ?Critical Care performed: No ? ?Procedures ? ? ?MEDICATIONS ORDERED IN ED: ?Medications - No data to display ? ? ?IMPRESSION / MDM / ASSESSMENT AND PLAN / ED COURSE  ?I reviewed the triage vital signs and the nursing notes. ?             ?               ? ?Differential diagnosis includes, but is not limited to, miscarriage, threatened miscarriage, ectopic pregnancy, normal pregnancy, subchorionic hemorrhage.  Anemia.  Will get labs and ultrasound to further evaluate.  Offered patient pelvic exam but given no significant bleeding and denies risk factors for STDs declined ? ?Patient is a positive therefore does not  need RhoGAM.  Hemoglobin is stable.  BMP is normal.  hCG has increased to 43,000. ? ?UA with concerns for possible UTI with bacteria therefore will start on Keflex.  She also also that clue cells present given patient is pregnant we discussed the pros and cons on treatment and she would like to start treatment. ? ?Discussed case with Dr. Amalia Hailey Recommended repeat ultrasound in 5 days..  Discussed with patient that this could just be due to early pregnancy where this can sometimes be called a threatened miscarriage and then she needs follow-up.  Repeat ultrasound to know for sure.  She expressed understanding, comfortable with this plan.  Rest pelvic rest. ? ? ? ?FINAL CLINICAL IMPRESSION(S) / ED DIAGNOSES  ? ?Final diagnoses:  ?Vaginal bleeding  ?Urinary tract infection in mother during first trimester of pregnancy  ?Bacterial vaginosis  ? ? ? ?Rx / DC Orders  ? ?ED Discharge Orders   ? ?      Ordered  ?  cephALEXin (KEFLEX) 500 MG capsule  4 times daily       ? 12/27/21 1524   ?  metroNIDAZOLE (FLAGYL) 500 MG tablet  2 times daily       ? 12/27/21 1524  ? ?  ?  ? ?  ? ? ? ?Note:  This document was prepared using Dragon voice recognition software and may include unintentional dictation errors. ?  ?Vanessa Westfield, MD ?12/27/21 1527 ? ?

## 2021-12-27 NOTE — ED Notes (Signed)
Called [place] to find out ETA for pt- told pt that [place] said it would be 20-30 minutes, pt agreeable to plan- pt given cup of water ?

## 2021-12-27 NOTE — ED Triage Notes (Signed)
Pt reports is approximately [redacted] weeks pregnant and had a sudden gush of blood today. Pt denies pain of any kind but reports a hx of 2 miscarriages.  ?

## 2021-12-27 NOTE — Discharge Instructions (Addendum)
We are starting you on some medications for possible UTI, bacterial vaginosis given you are pregnant.  Please call the OB team to make a follow-up appointment or go to the health department.  They recommend a repeat ultrasound in 5 days to further evaluate ? ? ? ?HCG 43,000  ? ?IMPRESSION:  ?1. Single intrauterine gestational sac with yolk sac, with estimated  ?gestational age of [redacted] weeks 1 day by mean sac diameter. Embryo is not  ?identified at this time.  ?2. Small subchronic hemorrhage.  ?   ? ?

## 2021-12-29 ENCOUNTER — Telehealth: Payer: Self-pay | Admitting: Obstetrics and Gynecology

## 2021-12-29 NOTE — Telephone Encounter (Signed)
Pt called and stated that she was seen in the hospital this past weekend and had an ultrasound. Pt stated they could not find a fetal Heart rate and she was advised to follow up with Dr.Evans. Please advise.  ?

## 2021-12-30 LAB — URINE CULTURE: Culture: >=100000 — AB

## 2022-01-02 ENCOUNTER — Other Ambulatory Visit (INDEPENDENT_AMBULATORY_CARE_PROVIDER_SITE_OTHER): Payer: Medicaid Other

## 2022-01-02 ENCOUNTER — Other Ambulatory Visit: Payer: Self-pay

## 2022-01-02 ENCOUNTER — Other Ambulatory Visit: Payer: Self-pay | Admitting: Obstetrics and Gynecology

## 2022-01-02 DIAGNOSIS — O3680X0 Pregnancy with inconclusive fetal viability, not applicable or unspecified: Secondary | ICD-10-CM

## 2022-01-07 ENCOUNTER — Other Ambulatory Visit: Payer: Medicaid Other

## 2022-01-19 ENCOUNTER — Encounter: Payer: Medicaid Other | Admitting: Obstetrics and Gynecology

## 2022-01-22 ENCOUNTER — Ambulatory Visit: Payer: Medicaid Other | Admitting: Obstetrics

## 2022-01-22 ENCOUNTER — Encounter: Payer: Self-pay | Admitting: Obstetrics

## 2022-01-22 VITALS — BP 106/70 | HR 77 | Ht 62.0 in | Wt 114.7 lb

## 2022-01-22 DIAGNOSIS — O418X10 Other specified disorders of amniotic fluid and membranes, first trimester, not applicable or unspecified: Secondary | ICD-10-CM | POA: Diagnosis not present

## 2022-01-22 DIAGNOSIS — Z3200 Encounter for pregnancy test, result unknown: Secondary | ICD-10-CM | POA: Diagnosis not present

## 2022-01-22 DIAGNOSIS — O468X1 Other antepartum hemorrhage, first trimester: Secondary | ICD-10-CM

## 2022-01-22 LAB — POCT URINE PREGNANCY: Preg Test, Ur: POSITIVE — AB

## 2022-01-22 MED ORDER — BUPROPION HCL ER (XL) 150 MG PO TB24: 150.0000 mg | ORAL_TABLET | Freq: Every day | ORAL | 2 refills | Status: DC

## 2022-01-22 NOTE — Progress Notes (Signed)
SUBJECTIVE ? ?Miha Schleisman is a 31 y.o. female who presents for evaluation of amenorrhea and possible pregnancy. Her LMP was 11/16/21. It was normal. She reports regular monthly periods. ?Pregnancy is desired. She was seen in the ED for vaginal bleeding, and US showed a resolving subchorionic hemorrhage. She had light spotting after that but no further bleeding.  ?Current symptoms include breast tenderness and fatigue ?She has a history of D&C at 22 weeks due to a fetal heart defect. Per her provider, it was not a genetic condition and is unlikely to recur in a future pregnancy. She currently smokes cigars. She has used Wellbutrin in a prior pregnancy for smoking cessation and mood stabilization, and she would like to start this again now. She has a history of HSV 2 with rare outbreaks. ? ?Review of Systems ?Pertinent items are noted in HPI. ? ?OBJECTIVE ?BP 106/70   Pulse 77   Ht 5\' 2"  (1.575 m)   Wt 114 lb 11.2 oz (52 kg)   LMP 11/16/2021   Breastfeeding Unknown   BMI 20.98 kg/m?  ?Body mass index is 20.98 kg/m?. ? ?Alert, well appearing, and in no distress, oriented to person, place, and time, and normal appearing weight ? ?Lab Review ?Urine hCG: positive ? ?ASSESSMENT ?Amenorrhea. Presumed pregnancy at [redacted]w[redacted]d with EDD of Estimated Date of Delivery: 08/23/22. ? ?PLAN  ?Encouraged a well-balanced diet, rest, hydration, prenatal vitamins, and walking for exercise. Counseled to avoid alcohol, tobacco, and recreational drugs and to minimize caffeine intake. Safe medications list given.  Reviewed midwifery care and general schedule of prenatal visits.  Genetic screening options were discussed.  A dating [place] was offered and ordered. Return in 1-2 weeks for a nurse visit for labs and prenatal education, and 3 for a NOB physical and genetic screening if desired. Rx for Wellbutrin sent to pharmacy. ? ?Lloyd Huger, CNM  ?

## 2022-02-02 ENCOUNTER — Ambulatory Visit (INDEPENDENT_AMBULATORY_CARE_PROVIDER_SITE_OTHER): Payer: Medicaid Other | Admitting: Obstetrics

## 2022-02-02 ENCOUNTER — Other Ambulatory Visit: Payer: Medicaid Other

## 2022-02-02 VITALS — BP 118/71 | HR 91 | Ht 62.0 in | Wt 112.8 lb

## 2022-02-02 DIAGNOSIS — Z3481 Encounter for supervision of other normal pregnancy, first trimester: Secondary | ICD-10-CM | POA: Diagnosis not present

## 2022-02-02 DIAGNOSIS — Z113 Encounter for screening for infections with a predominantly sexual mode of transmission: Secondary | ICD-10-CM

## 2022-02-02 DIAGNOSIS — Z3A11 11 weeks gestation of pregnancy: Secondary | ICD-10-CM

## 2022-02-02 LAB — OB RESULTS CONSOLE VARICELLA ZOSTER ANTIBODY, IGG: Varicella: IMMUNE

## 2022-02-02 LAB — OB RESULTS CONSOLE GC/CHLAMYDIA
Chlamydia: NEGATIVE
Neisseria Gonorrhea: NEGATIVE
Neisseria Gonorrhea: NEGATIVE

## 2022-02-02 NOTE — Progress Notes (Signed)
Pamela Mosley presents for NOB nurse intake visit. Pregnancy confirmation done at [facility] on 01/22/22 with Lloyd Huger, CNM.  G.5  P. 1021 LMP. 11/16/21 EDD. 09/02/22 [place]redacted[place]w[redacted]d Pregnancy education material explained and given.  0 cats in the home.  NOB labs ordered. BMI is not greater than 30. TSH/HbgA1c not ordered. Sickle cell ordered due to race. HIV and drug screen explained and ordered. Genetic screening discussed.  Pt to discuss genetic testing with provider. PNV encouraged. Pt to follow up with provider in 2 weeks for NOB physical.  FMLA,[facility] Financial Policy and HIV/Drug all reviewed and signed by patient.  ? ?

## 2022-02-03 LAB — URINALYSIS, ROUTINE W REFLEX MICROSCOPIC
Bilirubin, UA: NEGATIVE
Glucose, UA: NEGATIVE
Leukocytes,UA: NEGATIVE
Nitrite, UA: POSITIVE — AB
RBC, UA: NEGATIVE
Specific Gravity, UA: 1.025 (ref 1.005–1.030)
Urobilinogen, Ur: 0.2 mg/dL (ref 0.2–1.0)
pH, UA: 6 (ref 5.0–7.5)

## 2022-02-03 LAB — MICROSCOPIC EXAMINATION: Casts: NONE SEEN /lpf

## 2022-02-03 LAB — ABO AND RH: Rh Factor: POSITIVE

## 2022-02-03 LAB — RUBELLA SCREEN: Rubella Antibodies, IGG: 1.09 {index}

## 2022-02-03 LAB — VIRAL HEPATITIS HBV, HCV
HCV Ab: NONREACTIVE
Hep B Core Total Ab: NEGATIVE
Hep B Surface Ab, Qual: REACTIVE
Hepatitis B Surface Ag: NEGATIVE

## 2022-02-03 LAB — SYPHILIS: RPR W/REFLEX TO RPR TITER AND TREPONEMAL ANTIBODIES, TRADITIONAL SCREENING AND DIAGNOSIS ALGORITHM: RPR Ser Ql: NONREACTIVE

## 2022-02-03 LAB — ANTIBODY SCREEN: Antibody Screen: NEGATIVE

## 2022-02-03 LAB — VARICELLA ZOSTER ANTIBODY, IGG: Varicella zoster IgG: 682 index (ref >165)

## 2022-02-03 LAB — HIV ANTIBODY (ROUTINE TESTING W REFLEX): HIV Screen 4th Generation wRfx: NONREACTIVE

## 2022-02-03 LAB — HCV INTERPRETATION

## 2022-02-03 LAB — HGB SOLU + RFLX FRAC: Sickle Solubility Test - HGBRFX: NEGATIVE

## 2022-02-04 ENCOUNTER — Other Ambulatory Visit: Payer: Self-pay | Admitting: Obstetrics

## 2022-02-04 ENCOUNTER — Encounter: Payer: Self-pay | Admitting: Obstetrics

## 2022-02-04 LAB — PAIN MGT SCRN (14 DRUGS), UR
Amphetamine Scrn, Ur: NEGATIVE ng/mL
BARBITURATE SCREEN URINE: NEGATIVE ng/mL
BENZODIAZEPINE SCREEN, URINE: NEGATIVE ng/mL
Buprenorphine, Urine: NEGATIVE ng/mL
CANNABINOIDS UR QL SCN: NEGATIVE ng/mL
Cocaine (Metab) Scrn, Ur: NEGATIVE ng/mL
Creatinine(Crt), U: 138.2 mg/dL (ref 20.0–300.0)
Fentanyl, Urine: NEGATIVE pg/mL
Meperidine Screen, Urine: NEGATIVE ng/mL
Methadone Screen, Urine: NEGATIVE ng/mL
OXYCODONE+OXYMORPHONE UR QL SCN: NEGATIVE ng/mL
Opiate Scrn, Ur: NEGATIVE ng/mL
Ph of Urine: 6 (ref 4.5–8.9)
Phencyclidine Qn, Ur: NEGATIVE ng/mL
Propoxyphene Scrn, Ur: NEGATIVE ng/mL
Tramadol Screen, Urine: NEGATIVE ng/mL

## 2022-02-04 LAB — CULTURE, OB URINE

## 2022-02-04 LAB — NICOTINE SCREEN, URINE: Cotinine Ql Scrn, Ur: POSITIVE ng/mL — AB

## 2022-02-04 LAB — GC/CHLAMYDIA PROBE AMP
Chlamydia trachomatis, NAA: NEGATIVE
Neisseria Gonorrhoeae by PCR: NEGATIVE

## 2022-02-04 LAB — URINE CULTURE, OB REFLEX

## 2022-02-04 MED ORDER — NITROFURANTOIN MONOHYD MACRO 100 MG PO CAPS: 100.0000 mg | ORAL_CAPSULE | Freq: Two times a day (BID) | ORAL | 0 refills | Status: DC

## 2022-02-04 NOTE — Progress Notes (Signed)
+  UTI. Macrobid 100 mg PO BID x 7 days sent to pharmacy. [place] notified via MyChart. ? ?M. Chryl Heck, CNM ?

## 2022-02-12 ENCOUNTER — Ambulatory Visit (INDEPENDENT_AMBULATORY_CARE_PROVIDER_SITE_OTHER): Payer: Medicaid Other | Admitting: Obstetrics

## 2022-02-12 ENCOUNTER — Other Ambulatory Visit (HOSPITAL_COMMUNITY)
Admission: RE | Admit: 2022-02-12 | Discharge: 2022-02-12 | Disposition: A | Discharge status: Home / Self Care | Payer: Medicaid Other | Source: Ambulatory Visit | Attending: Obstetrics | Admitting: Obstetrics

## 2022-02-12 ENCOUNTER — Other Ambulatory Visit: Payer: Self-pay | Admitting: Obstetrics

## 2022-02-12 VITALS — BP 103/72 | HR 98 | Wt 114.6 lb

## 2022-02-12 DIAGNOSIS — Z1379 Encounter for other screening for genetic and chromosomal anomalies: Secondary | ICD-10-CM

## 2022-02-12 DIAGNOSIS — Z3481 Encounter for supervision of other normal pregnancy, first trimester: Secondary | ICD-10-CM

## 2022-02-12 DIAGNOSIS — Z124 Encounter for screening for malignant neoplasm of cervix: Secondary | ICD-10-CM | POA: Diagnosis present

## 2022-02-12 DIAGNOSIS — R399 Unspecified symptoms and signs involving the genitourinary system: Secondary | ICD-10-CM

## 2022-02-12 DIAGNOSIS — Z01419 Encounter for gynecological examination (general) (routine) without abnormal findings: Secondary | ICD-10-CM

## 2022-02-12 MED ORDER — BUPROPION HCL ER (SR) 150 MG PO TB12: 150.0000 mg | ORAL_TABLET | Freq: Two times a day (BID) | ORAL | 3 refills | Status: DC

## 2022-02-12 NOTE — Progress Notes (Signed)
NEW OB HISTORY AND PHYSICAL ? ?SUBJECTIVE:  ?    ? Nicolet Griffy is a 31 y.o. [phone] female, Patient's last menstrual period was 11/16/2021., Estimated Date of Delivery: 08/23/22, [redacted]w[redacted]d, presents today for establishment of Prenatal Care. ?She reports fatigue.  ?She is taking Wellbutrin for smoking cessation and mood control; she feels that it is helping. ? ?Social history ?Partner/Relationship: FOB involved ?Living situation: lives with husband and children ?Works at a [employer] ?Exercise: not currently ?Substance use: smokes cigars, denies EtOH, vape ?  ?Gynecologic History ?Patient's last menstrual period was 11/16/2021. Normal ?Contraception: none ?Last Pap: 2020. Results were: normal, negative HPV ? ?Obstetric History ?OB History  ?Gravida Para Term Preterm AB Living  ?5 2 2   2 2   ?SAB IAB Ectopic Multiple Live Births  ?2       2  ?  ?# Outcome Date GA Lbr Len/2nd Weight Sex Delivery Anes PTL Lv  ?5 Current           ?4 Term 2020     Vag-Spont   LIV  ?3 Term 11/12/10 [redacted]w[redacted]d  6 lb 9 oz (2.977 kg) F Vag-Spont   LIV  ?2 SAB           ?1 SAB           ? ? ?Past Medical History:  ?Diagnosis Date  ? Miscarriage 2009; 2014, 2021  ? NO SURGERY  ? Scoliosis   ? ? ?Past Surgical History:  ?Procedure Laterality Date  ? BACK SURGERY  2003  ? for scoliosis  ? DILATION AND CURETTAGE OF UTERUS    ? WISDOM TOOTH EXTRACTION  2012  ? all four  ? ? ?Current Outpatient Medications on File Prior to Visit  ?Medication Sig Dispense Refill  ? buPROPion (WELLBUTRIN XL) 150 MG 24 hr tablet Take 1 tablet (150 mg total) by mouth daily. 30 tablet 2  ? nitrofurantoin, macrocrystal-monohydrate, (MACROBID) 100 MG capsule Take 1 capsule (100 mg total) by mouth 2 (two) times daily. 14 capsule 0  ? Prenatal Vit-Fe Fumarate-FA (PRENATAL 19) tablet Chew 1 tablet by mouth daily.    ? ?No current facility-administered medications on file prior to visit.  ? ? ?Allergies  ?Allergen Reactions  ? Latex Rash  ? ? ?Social History  ? ?Socioeconomic  History  ? Marital status: Single  ?  Spouse name: Not on file  ? Number of children: 1  ? Years of education: 91  ? Highest education level: Not on file  ?Occupational History  ? Occupation: DENTAL ASSISTANT  ?Tobacco Use  ? Smoking status: Every Day  ?  Types: Cigars  ? Smokeless tobacco: Never  ? Tobacco comments:  ?  1 black and mild daily  ?Vaping Use  ? Vaping Use: Never used  ?Substance and Sexual Activity  ? Alcohol use: Not Currently  ?  Comment: socially  ? Drug use: Not Currently  ?  Frequency: 2.0 times per week  ?  Types: Marijuana  ? Sexual activity: Yes  ?  Birth control/protection: None  ?Other Topics Concern  ? Not on file  ?Social History Narrative  ? Not on file  ? ?Social Determinants of Health  ? ?Financial Resource Strain: Not on file  ?Food Insecurity: Not on file  ?Transportation Needs: Not on file  ?Physical Activity: Not on file  ?Stress: Not on file  ?Social Connections: Not on file  ?Intimate Partner Violence: Not on file  ? ? ?Family History  ?Problem Relation  Age of Onset  ? Hypertension Mother   ? Kidney disease Mother   ? Stroke Mother   ? Alcohol abuse Mother   ? Drug abuse Mother   ? Osteoarthritis Mother   ? Aneurysm Mother   ? Migraines Mother   ? Drug abuse Father   ? Cancer Father   ? Hyperlipidemia Father   ? Hypertension Father   ? Gestational diabetes Sister   ? Heart attack Maternal Grandmother   ? Heart murmur Maternal Grandmother   ? ? ?The following portions of the patient's history were reviewed and updated as appropriate: allergies, current medications, past OB history, past medical history, past surgical history, past family history, past social history, and problem list. ? ?History obtained from the patient ?General ROS: positive for  - chills, fever, and hot flashes ?negative for - fatigue ?Psychological ROS: positive for - anxiety ?negative for - depression ?Ophthalmic ROS: negative for - decreased vision ?Hematological and Lymphatic ROS: negative for - bleeding  problems, bruising, or swollen lymph nodes ?Endocrine ROS: negative for - breast changes, palpitations, or polydipsia/polyuria ?Breast ROS: positive for - breast lump that has been present since she was a teen ?Respiratory ROS: no cough, shortness of breath, or wheezing ?Cardiovascular ROS: no chest pain or dyspnea on exertion ?Gastrointestinal ROS: no abdominal pain, change in bowel habits, or black or bloody stools ?positive for - constipation ?Genito-Urinary ROS: no dysuria, trouble voiding, or hematuria ?Musculoskeletal ROS: positive for - joint pain, joint stiffness, and knee pain ?Dermatological ROS: positive for dry skin ? ? ?OBJECTIVE: ?Initial Physical Exam (New OB) ? ?GENERAL APPEARANCE: alert, well appearing, in no apparent distress, oriented to person, place and time ?HEAD: normocephalic, atraumatic ?MOUTH: mucous membranes moist, pharynx normal without lesions ?THYROID: no thyromegaly or masses present ?BREASTS: no masses noted, no significant tenderness, no palpable axillary nodes, no skin changes, lump in outer quadrant of right breast ?LUNGS: clear to auscultation, no wheezes, rales or rhonchi, symmetric air entry ?HEART: regular rate and rhythm, no murmurs ?ABDOMEN: soft, nontender, nondistended, no abnormal masses, no epigastric pain, fundus palpable above SP, FHT present: 160 ?EXTREMITIES: no redness or tenderness in the calves or thighs ?SKIN: normal coloration and turgor, no rashes ?LYMPH NODES: no adenopathy palpable ?NEUROLOGIC: alert, oriented, normal speech, no focal findings or movement disorder noted ? ?PELVIC EXAM EXTERNAL GENITALIA: normal appearing vulva with no masses, tenderness or lesions ?VAGINA: no abnormal discharge or lesions ?CERVIX: no lesions or cervical motion tenderness. Pap collected. Contact bleeding noted. ?RECTUM: hemorrhoid - external ? ?ASSESSMENT: ?Normal pregnancy ?[redacted]w[redacted]d ? ?PLAN: ?Routine prenatal care. We discussed an overview of prenatal care and when to call.  Reviewed diet, exercise, and weight gain recommendations in pregnancy. Reviewed adequate rest, vitamins, herbal mood support. Discussed benefits of breastfeeding and lactation resources at [facility]. Genetic testing today. I reviewed labs and answered all questions. RTC in 4 weeks.  ? ?See orders ? ?[name], CNM   ?

## 2022-02-13 LAB — CBC
Hematocrit: 34.6 % (ref 34.0–46.6)
Hemoglobin: 12 g/dL (ref 11.1–15.9)
MCH: 32.8 pg (ref 26.6–33.0)
MCHC: 34.7 g/dL (ref 31.5–35.7)
MCV: 95 fL (ref 79–97)
Platelets: 276 10*3/uL (ref 150–450)
RBC: 3.66 x10E6/uL — ABNORMAL LOW (ref 3.77–5.28)
RDW: 12.1 % (ref 11.7–15.4)
WBC: 9 10*3/uL (ref 3.4–10.8)

## 2022-02-14 LAB — URINE CULTURE: Organism ID, Bacteria: NO GROWTH

## 2022-02-16 ENCOUNTER — Ambulatory Visit (INDEPENDENT_AMBULATORY_CARE_PROVIDER_SITE_OTHER): Payer: Medicaid Other

## 2022-02-16 ENCOUNTER — Encounter: Payer: Self-pay | Admitting: Obstetrics

## 2022-02-16 DIAGNOSIS — O468X1 Other antepartum hemorrhage, first trimester: Secondary | ICD-10-CM

## 2022-02-16 DIAGNOSIS — O418X10 Other specified disorders of amniotic fluid and membranes, first trimester, not applicable or unspecified: Secondary | ICD-10-CM

## 2022-02-17 LAB — CYTOLOGY - PAP
Adequacy: ABSENT
Comment: NEGATIVE
Diagnosis: NEGATIVE
High risk HPV: NEGATIVE

## 2022-02-18 ENCOUNTER — Encounter: Payer: Self-pay | Admitting: Obstetrics

## 2022-03-18 ENCOUNTER — Ambulatory Visit (INDEPENDENT_AMBULATORY_CARE_PROVIDER_SITE_OTHER): Payer: Medicaid Other | Admitting: Certified Nurse Midwife

## 2022-03-18 ENCOUNTER — Encounter: Payer: Self-pay | Admitting: Certified Nurse Midwife

## 2022-03-18 VITALS — BP 101/65 | HR 84 | Wt 121.4 lb

## 2022-03-18 DIAGNOSIS — Z3A17 17 weeks gestation of pregnancy: Secondary | ICD-10-CM

## 2022-03-18 DIAGNOSIS — Z3482 Encounter for supervision of other normal pregnancy, second trimester: Secondary | ICD-10-CM

## 2022-03-18 LAB — POCT URINALYSIS DIPSTICK OB
Bilirubin, UA: NEGATIVE
Blood, UA: NEGATIVE
Glucose, UA: NEGATIVE
Ketones, UA: NEGATIVE
Leukocytes, UA: NEGATIVE
Nitrite, UA: NEGATIVE
POC,PROTEIN,UA: NEGATIVE
Spec Grav, UA: 1.015 (ref 1.010–1.025)
Urobilinogen, UA: 0.2 E.U./dL
pH, UA: 6.5 (ref 5.0–8.0)

## 2022-03-18 NOTE — Patient Instructions (Signed)
Round Ligament Pain  The round ligaments are a pair of cord-like tissues that help support the uterus. They can become a source of pain during pregnancy as the ligaments soften and stretch as the baby grows. The pain usually begins in the second trimester (13-28 weeks) of pregnancy, and should only last for a few seconds when it occurs. However, the pain can come and go until the baby is delivered. The pain does not cause harm to the baby. Round ligament pain is usually a short, sharp, and pinching pain, but it can also be a dull, lingering, and aching pain. The pain is felt in the lower side of the abdomen or in the groin. It usually starts deep in the groin and moves up to the outside of the hip area. The pain may happen when you: Suddenly change position, such as quickly going from a sitting to standing position. Do physical activity. Cough or sneeze. Follow these instructions at home: Managing pain  When the pain starts, relax. Then, try any of these methods to help with the pain: Sit down. Flex your knees up to your abdomen. Lie on your side with one pillow under your abdomen and another pillow between your legs. Sit in a warm bath for 15-20 minutes or until the pain goes away. General instructions Watch your condition for any changes. Move slowly when you sit down or stand up. Stop or reduce your physical activities if they cause pain. Avoid long walks if they cause pain. Take over-the-counter and prescription medicines only as told by your health care provider. Keep all follow-up visits. This is important. Contact a health care provider if: Your pain does not go away with treatment. You feel pain in your back that you did not have before. Your medicine is not helping. You have a fever or chills. You have nausea or vomiting. You have diarrhea. You have pain when you urinate. Get help right away if: You have pain that is a rhythmic, cramping pain similar to labor pains. Labor  pains are usually 2 minutes apart, last for about 1 minute, and involve a bearing down feeling or pressure in your pelvis. You have vaginal bleeding. These symptoms may represent a serious problem that is an emergency. Do not wait to see if the symptoms will go away. Get medical help right away. Call your local emergency services (911 in the [place] Do not drive yourself to the hospital. Summary Round ligament pain is felt in the lower abdomen or groin. This pain usually begins in the second trimester (13-28 weeks) and should only last for a few seconds when it occurs. You may notice the pain when you suddenly change position, when you cough or sneeze, or during physical activity. Relaxing, flexing your knees to your abdomen, lying on one side, or taking a warm bath may help to get rid of the pain. Contact your health care provider if the pain does not go away. This information is not intended to replace advice given to you by your health care provider. Make sure you discuss any questions you have with your health care provider. Document Revised: 12/18/2020 Document Reviewed: 12/18/2020 Elsevier Patient Education  2023 Elsevier Inc.  

## 2022-03-18 NOTE — Progress Notes (Signed)
ROB doing well, feeling some movement. Reviewed round ligament pain and self help measures. Discussed u/s for anatomy next visit. TOC for urine collected. Follow up 3 wks for anatomy u/s & ROB.   Doreene Burke, CNM

## 2022-03-21 LAB — URINE CULTURE, OB REFLEX

## 2022-03-21 LAB — CULTURE, OB URINE

## 2022-03-24 ENCOUNTER — Other Ambulatory Visit: Payer: Self-pay | Admitting: Certified Nurse Midwife

## 2022-03-24 MED ORDER — NITROFURANTOIN MONOHYD MACRO 100 MG PO CAPS: 100.0000 mg | ORAL_CAPSULE | Freq: Every day | ORAL | 5 refills | Status: DC

## 2022-03-24 MED ORDER — CEFTRIAXONE SODIUM 500 MG IJ SOLR
1000.0000 mg | Freq: Once | INTRAMUSCULAR | Status: AC
  Administered 2022-03-27: 1000 mg via INTRAMUSCULAR

## 2022-03-27 ENCOUNTER — Ambulatory Visit (INDEPENDENT_AMBULATORY_CARE_PROVIDER_SITE_OTHER): Payer: Medicaid Other | Admitting: Certified Nurse Midwife

## 2022-03-27 DIAGNOSIS — B9620 Unspecified Escherichia coli [E. coli] as the cause of diseases classified elsewhere: Secondary | ICD-10-CM

## 2022-03-27 DIAGNOSIS — N390 Urinary tract infection, site not specified: Secondary | ICD-10-CM | POA: Diagnosis not present

## 2022-03-27 NOTE — Addendum Note (Signed)
Addended by: Georgiana Shore R on: 03/27/2022 02:24 PM   Modules accepted: Level of Service

## 2022-03-27 NOTE — Progress Notes (Signed)
Patient presented for Rocephin injection. She states her only symptom remaining is urinary frequency. She denies burning, cloudiness and odor. Patient tolerated injection well. All questions answered.

## 2022-04-12 ENCOUNTER — Encounter: Payer: Self-pay | Admitting: Emergency Medicine

## 2022-04-12 ENCOUNTER — Ambulatory Visit
Admission: EM | Admit: 2022-04-12 | Discharge: 2022-04-12 | Disposition: A | Discharge status: Home / Self Care | Payer: Medicaid Other | Attending: Internal Medicine | Admitting: Internal Medicine

## 2022-04-12 ENCOUNTER — Other Ambulatory Visit: Payer: Self-pay

## 2022-04-12 DIAGNOSIS — Z3A22 22 weeks gestation of pregnancy: Secondary | ICD-10-CM | POA: Diagnosis not present

## 2022-04-12 DIAGNOSIS — O26892 Other specified pregnancy related conditions, second trimester: Secondary | ICD-10-CM | POA: Diagnosis present

## 2022-04-12 DIAGNOSIS — J069 Acute upper respiratory infection, unspecified: Secondary | ICD-10-CM | POA: Diagnosis not present

## 2022-04-12 DIAGNOSIS — J029 Acute pharyngitis, unspecified: Secondary | ICD-10-CM

## 2022-04-12 DIAGNOSIS — Z20822 Contact with and (suspected) exposure to covid-19: Secondary | ICD-10-CM | POA: Insufficient documentation

## 2022-04-12 DIAGNOSIS — O99512 Diseases of the respiratory system complicating pregnancy, second trimester: Secondary | ICD-10-CM | POA: Diagnosis not present

## 2022-04-12 LAB — RESP PANEL BY RT-PCR (FLU A&B, COVID) ARPGX2
Influenza A by PCR: NEGATIVE
Influenza B by PCR: NEGATIVE
SARS Coronavirus 2 by RT PCR: NEGATIVE

## 2022-04-12 LAB — GROUP A STREP BY PCR: Group A Strep by PCR: NOT DETECTED

## 2022-04-13 ENCOUNTER — Encounter: Payer: Medicaid Other | Admitting: Obstetrics

## 2022-04-13 ENCOUNTER — Other Ambulatory Visit: Payer: Medicaid Other

## 2022-04-14 ENCOUNTER — Ambulatory Visit (INDEPENDENT_AMBULATORY_CARE_PROVIDER_SITE_OTHER): Payer: Medicaid Other | Admitting: Obstetrics

## 2022-04-14 ENCOUNTER — Ambulatory Visit (INDEPENDENT_AMBULATORY_CARE_PROVIDER_SITE_OTHER): Payer: Medicaid Other

## 2022-04-14 ENCOUNTER — Encounter: Payer: Self-pay | Admitting: Obstetrics

## 2022-04-14 VITALS — BP 112/72 | HR 66 | Wt 123.6 lb

## 2022-04-14 DIAGNOSIS — Z3482 Encounter for supervision of other normal pregnancy, second trimester: Secondary | ICD-10-CM

## 2022-04-14 DIAGNOSIS — R399 Unspecified symptoms and signs involving the genitourinary system: Secondary | ICD-10-CM

## 2022-04-14 DIAGNOSIS — Z3A21 21 weeks gestation of pregnancy: Secondary | ICD-10-CM

## 2022-04-14 LAB — POCT URINALYSIS DIPSTICK OB
Bilirubin, UA: NEGATIVE
Blood, UA: NEGATIVE
Glucose, UA: NEGATIVE
Ketones, UA: NEGATIVE
Leukocytes, UA: NEGATIVE
Nitrite, UA: NEGATIVE
POC,PROTEIN,UA: NEGATIVE
Spec Grav, UA: 1.02 (ref 1.010–1.025)
Urobilinogen, UA: 0.2 E.U./dL
pH, UA: 6.5 (ref 5.0–8.0)

## 2022-04-14 NOTE — Progress Notes (Addendum)
ROB at [redacted]w[redacted]d. Active baby. Denies ctx, LOF, and vaginal bleeding. [place] has been experiencing URI symptoms for the past few weeks. Discussed comfort measures. She is also having frequent pelvic pain. Discussed finding an appropriately sized support belt, yoga exercises, pelvic floor strengthening. Anatomy US today. Report not final, WNL per [place] tech. Reviewed Macrobid suppression for recurrent UTI. No UTI s/s today. TOC sent. RTC in 4 weeks.  [name], CNM

## 2022-04-16 LAB — URINE CULTURE

## 2022-05-07 IMAGING — US US OB < 14 WEEKS - US OB TV
1 series · 14 of 28 positions shown · non-contrast
Comparison: None.

CLINICAL DATA: 31-year-old pregnant female with vaginal bleeding
today. Estimated gestational age of 6 weeks 6 days by LMP.

EXAM:
OBSTETRIC <14 WK US AND TRANSVAGINAL OB US
TECHNIQUE: Both transabdominal and transvaginal ultrasound examinations were
performed for complete evaluation of the gestation as well as the
maternal uterus, adnexal regions, and pelvic cul-de-sac.
Transvaginal technique was performed to assess early pregnancy.

[Series 1: us ob less than 14 weeks with ob transvaginal · 14 of 124 slices shown]
[im 5/124]
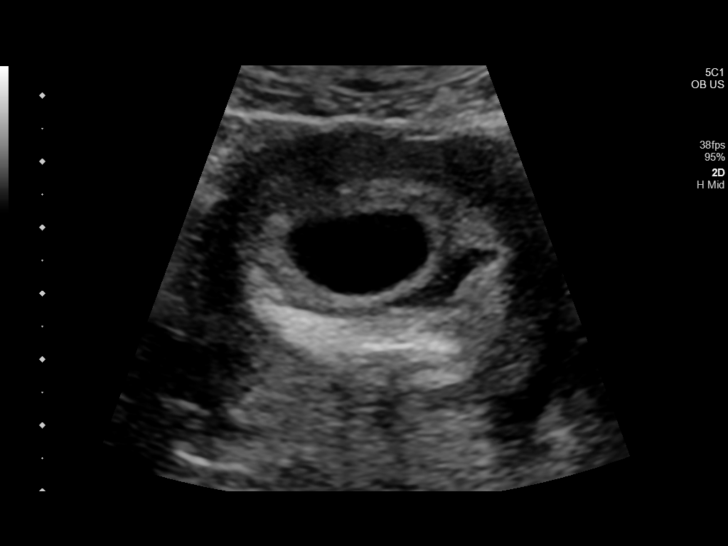
[im 14/124]
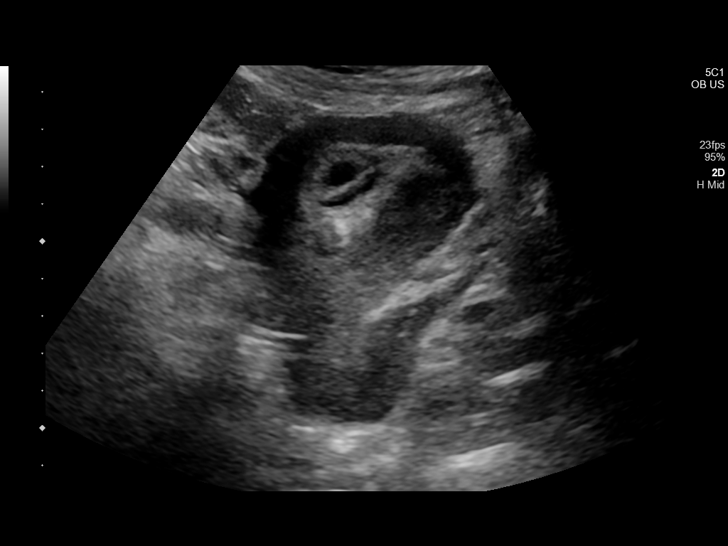
[im 23/124]
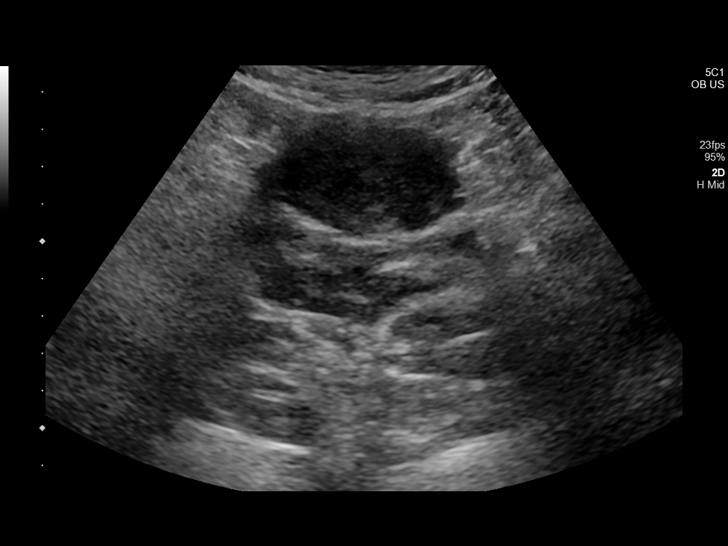
[im 32/124]
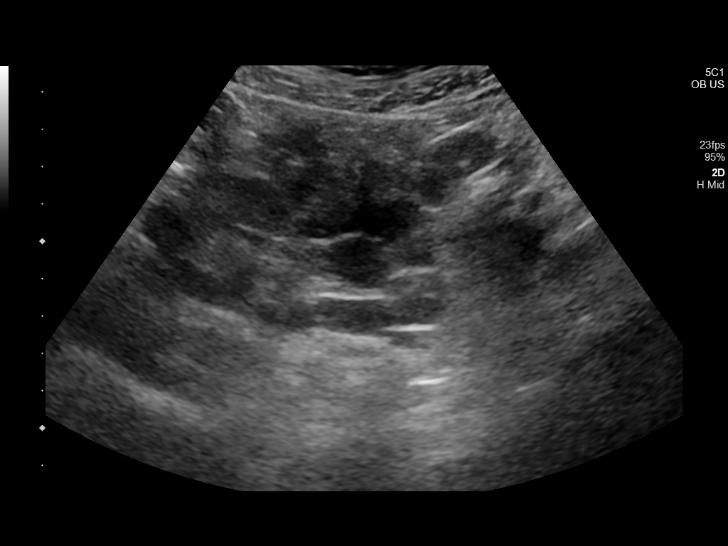
[im 42/124]
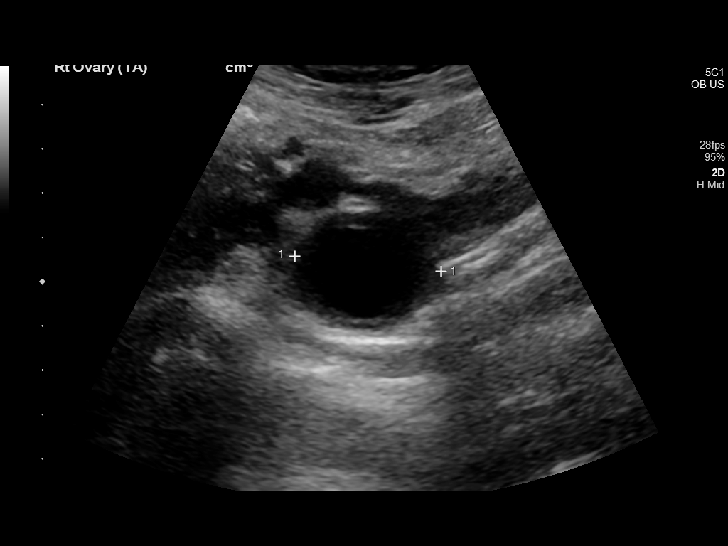
[im 51/124]
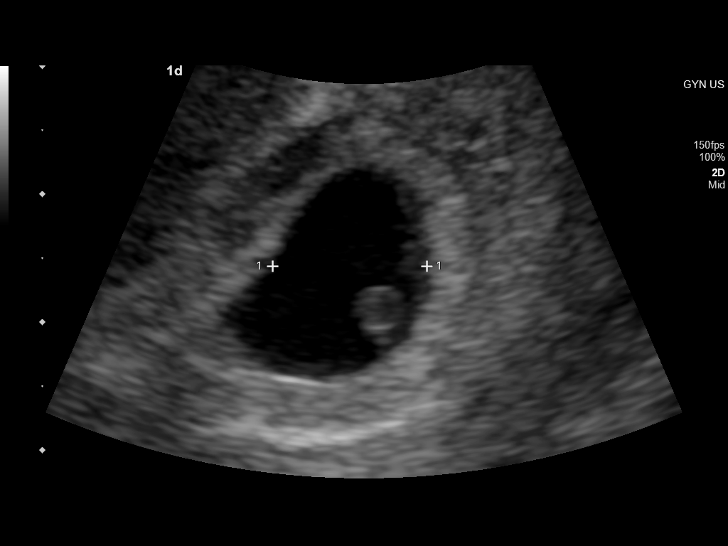
[im 60/124]
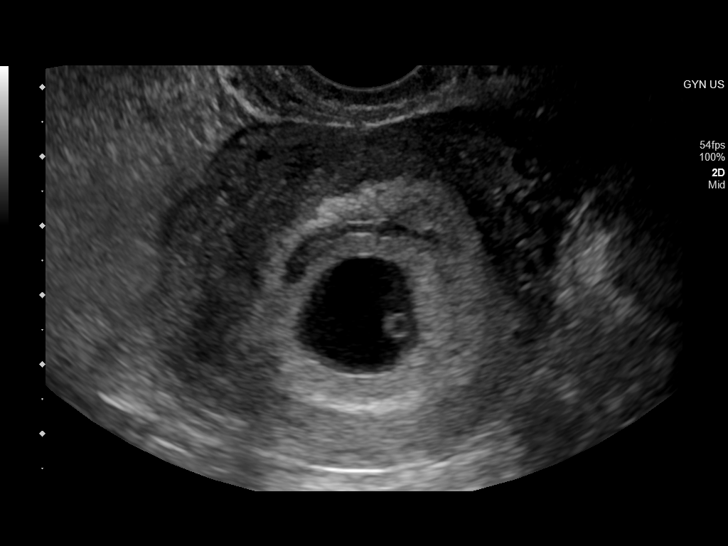
[im 69/124]
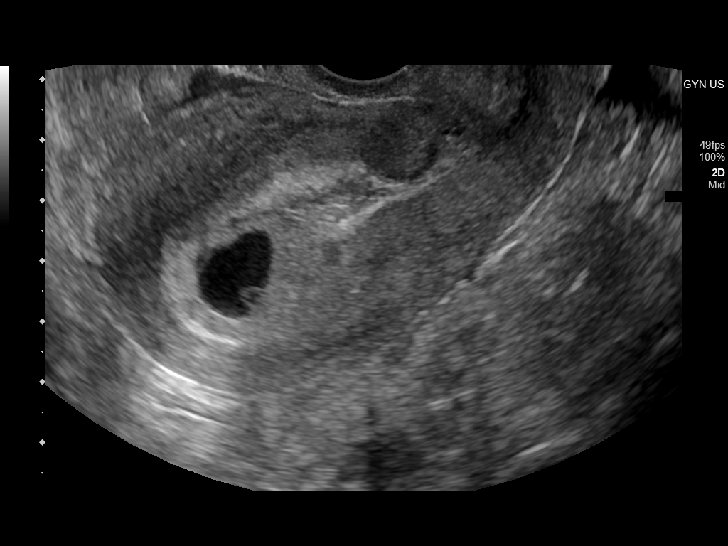
[im 78/124]
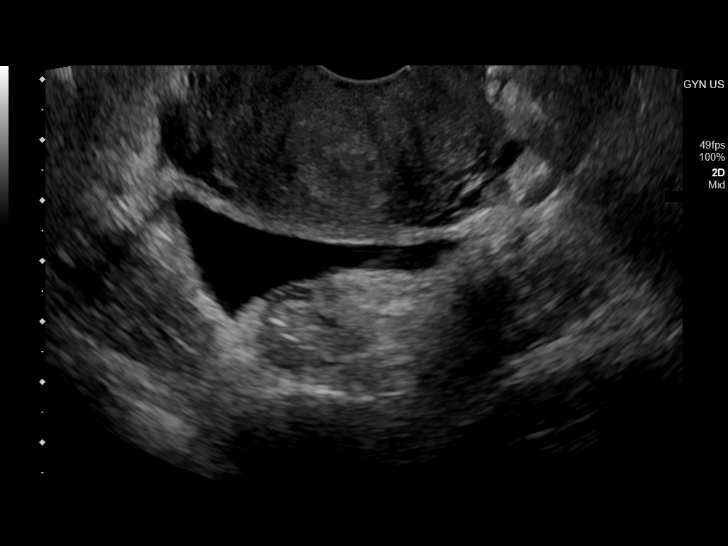
[im 87/124]
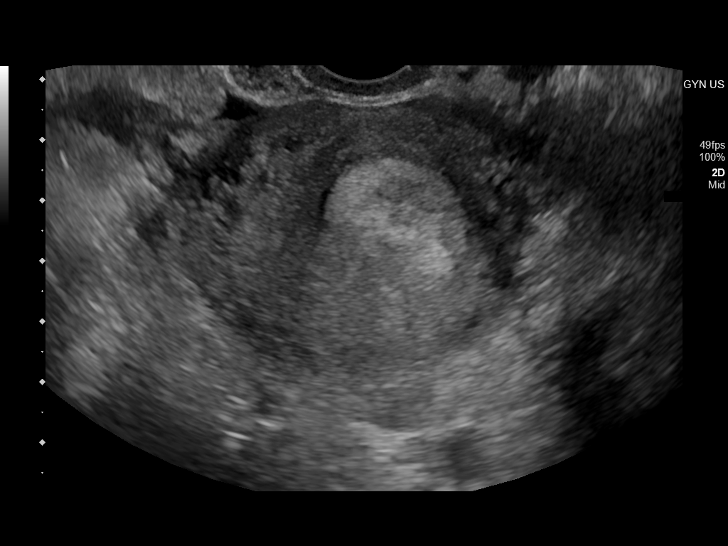
[im 96/124]
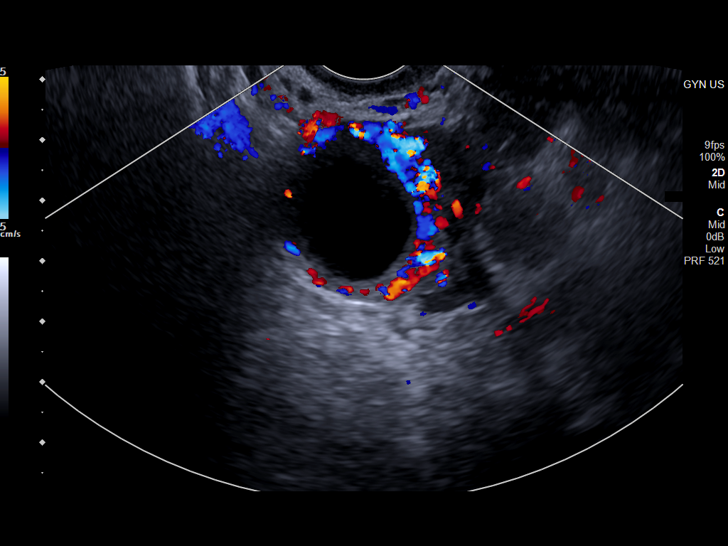
[im 105/124]
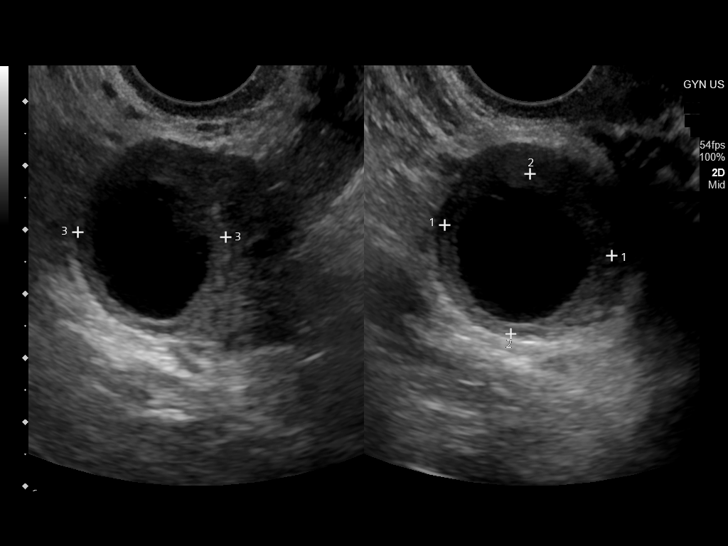
[im 114/124]
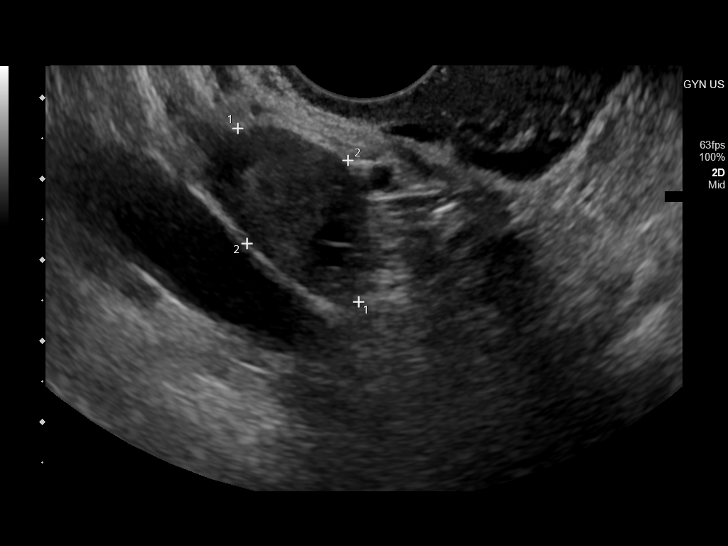
[im 124/124]
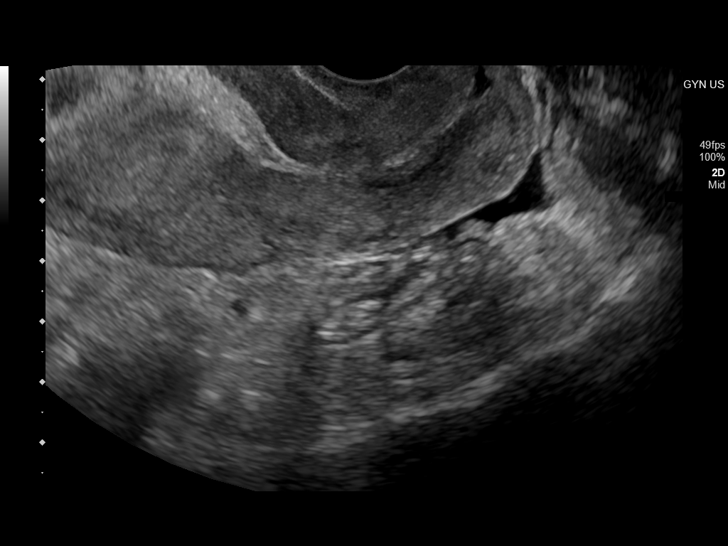

[14 of 28 positions shown; findings below may reference images not displayed]

FINDINGS: Intrauterine gestational sac: Single

Yolk sac:  Visualized.

Embryo:  Not Visualized.

Cardiac Activity: Not Visualized.

MSD: 12.7 mm   6 w   1 d

Subchorionic hemorrhage:  Small measuring 2.8 x 0.6 x 2.3 cm

Maternal uterus/adnexae: No significant abnormalities.

Trace amount of free pelvic fluid.
IMPRESSION: 1. Single intrauterine gestational sac with yolk sac, with estimated
gestational age of 6 weeks 1 day by mean sac diameter. Embryo is not
identified at this time.
2. Small subchronic hemorrhage.

## 2022-05-14 ENCOUNTER — Encounter: Payer: Self-pay | Admitting: Certified Nurse Midwife

## 2022-05-14 ENCOUNTER — Ambulatory Visit (INDEPENDENT_AMBULATORY_CARE_PROVIDER_SITE_OTHER): Payer: Medicaid Other | Admitting: Certified Nurse Midwife

## 2022-05-14 VITALS — BP 108/68 | HR 85 | Wt 129.1 lb

## 2022-05-14 DIAGNOSIS — O43113 Circumvallate placenta, third trimester: Secondary | ICD-10-CM

## 2022-05-14 DIAGNOSIS — Z3A25 25 weeks gestation of pregnancy: Secondary | ICD-10-CM

## 2022-05-14 NOTE — Progress Notes (Signed)
Pamela Mosley doing well, feeling good fetal movement. Discussed glucose screening next visit. Information sheet given on eating prior to testing. She verbalizes and agrees to plan. Pt c/o back pain and round ligament pain. She has been using a belly band with little relief. She has history of scoliosis which contributes to back pain, she is unable to use chiropractor as she has rods in her back. This has been causing her issues with work. 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  Follow up 3 wks for glucose and Pamela Mosley.  Doreene Burke,

## 2022-05-14 NOTE — Patient Instructions (Signed)
Oral Glucose Tolerance Test During Pregnancy Why am I having this test? The oral glucose tolerance test (OGTT) is done to check how your body processes blood sugar (glucose). This is one of several tests used to diagnose diabetes that develops during pregnancy (gestational diabetes mellitus). Gestational diabetes is a short-term form of diabetes that some women develop while they are pregnant. It usually occurs during the second trimester of pregnancy and goes away after delivery. Testing, or screening, for gestational diabetes usually occurs at weeks 24-28 of pregnancy. You may have the OGTT test after having a 1-hour glucose screening test if the results from that test indicate that you may have gestational diabetes. This test may also be needed if: You have a history of gestational diabetes. There is a history of giving birth to very large babies or of losing pregnancies (having stillbirths). You have signs and symptoms of diabetes, such as: Changes in your eyesight. Tingling or numbness in your hands or feet. Changes in hunger, thirst, and urination, and these are not explained by your pregnancy. What is being tested? This test measures the amount of glucose in your blood at different times during a period of 3 hours. This shows how well your body can process glucose. What kind of sample is taken?  Blood samples are required for this test. They are usually collected by inserting a needle into a blood vessel. How do I prepare for this test? For 3 days before your test, eat normally. Have plenty of carbohydrate-rich foods. Follow instructions from your health care provider about: Eating or drinking restrictions on the day of the test. You may be asked not to eat or drink anything other than water (to fast) starting 8-10 hours before the test. Changing or stopping your regular medicines. Some medicines may interfere with this test. Tell a health care provider about: All medicines you are  taking, including vitamins, herbs, eye drops, creams, and over-the-counter medicines. Any blood disorders you have. Any surgeries you have had. Any medical conditions you have. What happens during the test? First, your blood glucose will be measured. This is referred to as your fasting blood glucose because you fasted before the test. Then, you will drink a glucose solution that contains a certain amount of glucose. Your blood glucose will be measured again 1, 2, and 3 hours after you drink the solution. This test takes about 3 hours to complete. You will need to stay at the testing location during this time. During the testing period: Do not eat or drink anything other than the glucose solution. Do not exercise. Do not use any products that contain nicotine or tobacco, such as cigarettes, e-cigarettes, and chewing tobacco. These can affect your test results. If you need help quitting, ask your health care provider. The testing procedure may vary among health care providers and hospitals. How are the results reported? Your results will be reported as milligrams of glucose per deciliter of blood (mg/dL) or millimoles per liter (mmol/L). There is more than one source for screening and diagnosis reference values used to diagnose gestational diabetes. Your health care provider will compare your results to normal values that were established after testing a large group of people (reference values). Reference values may vary among labs and hospitals. For this test (Carpenter-Coustan), reference values are: Fasting: 95 mg/dL (5.3 mmol/L). 1 hour: 180 mg/dL (10.0 mmol/L). 2 hour: 155 mg/dL (8.6 mmol/L). 3 hour: 140 mg/dL (7.8 mmol/L). What do the results mean? Results below the reference values are   considered normal. If two or more of your blood glucose levels are at or above the reference values, you may be diagnosed with gestational diabetes. If only one level is high, your health care provider may  suggest repeat testing or other tests to confirm a diagnosis. Talk with your health care provider about what your results mean. Questions to ask your health care provider Ask your health care provider, or the department that is doing the test: When will my results be ready? How will I get my results? What are my treatment options? What other tests do I need? What are my next steps? Summary The oral glucose tolerance test (OGTT) is one of several tests used to diagnose diabetes that develops during pregnancy (gestational diabetes mellitus). Gestational diabetes is a short-term form of diabetes that some women develop while they are pregnant. You may have the OGTT test after having a 1-hour glucose screening test if the results from that test show that you may have gestational diabetes. You may also have this test if you have any symptoms or risk factors for this type of diabetes. Talk with your health care provider about what your results mean. This information is not intended to replace advice given to you by your health care provider. Make sure you discuss any questions you have with your health care provider. Document Revised: 03/14/2020 Document Reviewed: 03/14/2020 Elsevier Patient Education  2023 Elsevier Inc.  

## 2022-06-03 ENCOUNTER — Ambulatory Visit: Payer: Medicaid Other

## 2022-06-03 ENCOUNTER — Ambulatory Visit (INDEPENDENT_AMBULATORY_CARE_PROVIDER_SITE_OTHER): Payer: Medicaid Other | Admitting: Certified Nurse Midwife

## 2022-06-03 ENCOUNTER — Encounter: Payer: Self-pay | Admitting: Certified Nurse Midwife

## 2022-06-03 ENCOUNTER — Other Ambulatory Visit: Payer: Medicaid Other

## 2022-06-03 VITALS — BP 99/65 | HR 96 | Wt 132.1 lb

## 2022-06-03 DIAGNOSIS — Z3A28 28 weeks gestation of pregnancy: Secondary | ICD-10-CM

## 2022-06-03 MED ORDER — TETANUS-DIPHTH-ACELL PERTUSSIS 5-2.5-18.5 LF-MCG/0.5 IM SUSY
0.5000 mL | PREFILLED_SYRINGE | Freq: Once | INTRAMUSCULAR | Status: AC
  Administered 2022-06-03: 0.5 mL via INTRAMUSCULAR

## 2022-06-03 NOTE — Patient Instructions (Signed)
Oral Glucose Tolerance Test During Pregnancy Why am I having this test? The oral glucose tolerance test (OGTT) is done to check how your body processes blood sugar (glucose). This is one of several tests used to diagnose diabetes that develops during pregnancy (gestational diabetes mellitus). Gestational diabetes is a short-term form of diabetes that some women develop while they are pregnant. It usually occurs during the second trimester of pregnancy and goes away after delivery. Testing, or screening, for gestational diabetes usually occurs at weeks 24-28 of pregnancy. You may have the OGTT test after having a 1-hour glucose screening test if the results from that test indicate that you may have gestational diabetes. This test may also be needed if: You have a history of gestational diabetes. There is a history of giving birth to very large babies or of losing pregnancies (having stillbirths). You have signs and symptoms of diabetes, such as: Changes in your eyesight. Tingling or numbness in your hands or feet. Changes in hunger, thirst, and urination, and these are not explained by your pregnancy. What is being tested? This test measures the amount of glucose in your blood at different times during a period of 3 hours. This shows how well your body can process glucose. What kind of sample is taken?  Blood samples are required for this test. They are usually collected by inserting a needle into a blood vessel. How do I prepare for this test? For 3 days before your test, eat normally. Have plenty of carbohydrate-rich foods. Follow instructions from your health care provider about: Eating or drinking restrictions on the day of the test. You may be asked not to eat or drink anything other than water (to fast) starting 8-10 hours before the test. Changing or stopping your regular medicines. Some medicines may interfere with this test. Tell a health care provider about: All medicines you are  taking, including vitamins, herbs, eye drops, creams, and over-the-counter medicines. Any blood disorders you have. Any surgeries you have had. Any medical conditions you have. What happens during the test? First, your blood glucose will be measured. This is referred to as your fasting blood glucose because you fasted before the test. Then, you will drink a glucose solution that contains a certain amount of glucose. Your blood glucose will be measured again 1, 2, and 3 hours after you drink the solution. This test takes about 3 hours to complete. You will need to stay at the testing location during this time. During the testing period: Do not eat or drink anything other than the glucose solution. Do not exercise. Do not use any products that contain nicotine or tobacco, such as cigarettes, e-cigarettes, and chewing tobacco. These can affect your test results. If you need help quitting, ask your health care provider. The testing procedure may vary among health care providers and hospitals. How are the results reported? Your results will be reported as milligrams of glucose per deciliter of blood (mg/dL) or millimoles per liter (mmol/L). There is more than one source for screening and diagnosis reference values used to diagnose gestational diabetes. Your health care provider will compare your results to normal values that were established after testing a large group of people (reference values). Reference values may vary among labs and hospitals. For this test (Carpenter-Coustan), reference values are: Fasting: 95 mg/dL (5.3 mmol/L). 1 hour: 180 mg/dL (10.0 mmol/L). 2 hour: 155 mg/dL (8.6 mmol/L). 3 hour: 140 mg/dL (7.8 mmol/L). What do the results mean? Results below the reference values are   considered normal. If two or more of your blood glucose levels are at or above the reference values, you may be diagnosed with gestational diabetes. If only one level is high, your health care provider may  suggest repeat testing or other tests to confirm a diagnosis. Talk with your health care provider about what your results mean. Questions to ask your health care provider Ask your health care provider, or the department that is doing the test: When will my results be ready? How will I get my results? What are my treatment options? What other tests do I need? What are my next steps? Summary The oral glucose tolerance test (OGTT) is one of several tests used to diagnose diabetes that develops during pregnancy (gestational diabetes mellitus). Gestational diabetes is a short-term form of diabetes that some women develop while they are pregnant. You may have the OGTT test after having a 1-hour glucose screening test if the results from that test show that you may have gestational diabetes. You may also have this test if you have any symptoms or risk factors for this type of diabetes. Talk with your health care provider about what your results mean. This information is not intended to replace advice given to you by your health care provider. Make sure you discuss any questions you have with your health care provider. Document Revised: 03/14/2020 Document Reviewed: 03/14/2020 Elsevier Patient Education  2023 Elsevier Inc.  

## 2022-06-03 NOTE — Progress Notes (Signed)
ROB doing well. Feels good movement. 28 wk labs today: Glucose screen/RPR/CBC. Tdap, Blood transfusion consent completed, all questions answered. Ready set baby reviewed, see check list for topics covered. Sample birth plan given, will follow up in upcoming visits. Discussed birth control after delivery, information pamphlet given. Has u/s 8/18 for growth due to circumvallate placenta.   Follow up 2 wk with Missy for ROB or sooner if needed.    Doreene Burke, CNM

## 2022-06-04 ENCOUNTER — Other Ambulatory Visit: Payer: Self-pay | Admitting: Certified Nurse Midwife

## 2022-06-04 ENCOUNTER — Encounter: Payer: Self-pay | Admitting: Certified Nurse Midwife

## 2022-06-04 LAB — CBC
Hematocrit: 31 % — ABNORMAL LOW (ref 34.0–46.6)
Hemoglobin: 10.8 g/dL — ABNORMAL LOW (ref 11.1–15.9)
MCH: 32.4 pg (ref 26.6–33.0)
MCHC: 34.8 g/dL (ref 31.5–35.7)
MCV: 93 fL (ref 79–97)
Platelets: 239 10*3/uL (ref 150–450)
RBC: 3.33 x10E6/uL — ABNORMAL LOW (ref 3.77–5.28)
RDW: 11.3 % — ABNORMAL LOW (ref 11.7–15.4)
WBC: 8.5 10*3/uL (ref 3.4–10.8)

## 2022-06-04 LAB — RPR: RPR Ser Ql: NONREACTIVE

## 2022-06-04 LAB — GLUCOSE, 1 HOUR GESTATIONAL: Gestational Diabetes Screen: 89 mg/dL (ref 70–139)

## 2022-06-04 MED ORDER — FUSION PLUS PO CAPS: 1.0000 | ORAL_CAPSULE | Freq: Every day | ORAL | 11 refills | Status: DC

## 2022-06-05 ENCOUNTER — Ambulatory Visit (INDEPENDENT_AMBULATORY_CARE_PROVIDER_SITE_OTHER): Payer: Medicaid Other

## 2022-06-05 DIAGNOSIS — Z3A25 25 weeks gestation of pregnancy: Secondary | ICD-10-CM

## 2022-06-05 DIAGNOSIS — O43113 Circumvallate placenta, third trimester: Secondary | ICD-10-CM | POA: Diagnosis not present

## 2022-06-20 ENCOUNTER — Encounter: Payer: Self-pay | Admitting: Obstetrics and Gynecology

## 2022-06-20 ENCOUNTER — Observation Stay
Admission: EM | Admit: 2022-06-20 | Discharge: 2022-06-20 | Disposition: A | Discharge status: Home / Self Care | Payer: Medicaid Other | Attending: Obstetrics | Admitting: Obstetrics

## 2022-06-20 DIAGNOSIS — O26893 Other specified pregnancy related conditions, third trimester: Principal | ICD-10-CM | POA: Diagnosis present

## 2022-06-20 DIAGNOSIS — U071 COVID-19: Secondary | ICD-10-CM | POA: Insufficient documentation

## 2022-06-20 DIAGNOSIS — Z3A30 30 weeks gestation of pregnancy: Secondary | ICD-10-CM | POA: Insufficient documentation

## 2022-06-20 DIAGNOSIS — Z79899 Other long term (current) drug therapy: Secondary | ICD-10-CM | POA: Diagnosis not present

## 2022-06-20 DIAGNOSIS — Z3480 Encounter for supervision of other normal pregnancy, unspecified trimester: Secondary | ICD-10-CM

## 2022-06-20 DIAGNOSIS — R102 Pelvic and perineal pain: Secondary | ICD-10-CM | POA: Insufficient documentation

## 2022-06-20 DIAGNOSIS — Z91040 Latex allergy status: Secondary | ICD-10-CM | POA: Diagnosis not present

## 2022-06-20 DIAGNOSIS — Z87891 Personal history of nicotine dependence: Secondary | ICD-10-CM | POA: Diagnosis not present

## 2022-06-20 DIAGNOSIS — R1032 Left lower quadrant pain: Secondary | ICD-10-CM | POA: Insufficient documentation

## 2022-06-20 DIAGNOSIS — O09523 Supervision of elderly multigravida, third trimester: Secondary | ICD-10-CM | POA: Insufficient documentation

## 2022-06-20 DIAGNOSIS — O98513 Other viral diseases complicating pregnancy, third trimester: Secondary | ICD-10-CM | POA: Insufficient documentation

## 2022-06-20 LAB — URINALYSIS, ROUTINE W REFLEX MICROSCOPIC
Bilirubin Urine: NEGATIVE
Glucose, UA: NEGATIVE mg/dL
Hgb urine dipstick: NEGATIVE
Ketones, ur: 80 mg/dL — AB
Leukocytes,Ua: NEGATIVE
Nitrite: NEGATIVE
Protein, ur: NEGATIVE mg/dL
Specific Gravity, Urine: 1.01 (ref 1.005–1.030)
pH: 6 (ref 5.0–8.0)

## 2022-06-20 MED ORDER — ACETAMINOPHEN 325 MG PO TABS
650.0000 mg | ORAL_TABLET | ORAL | Status: DC | PRN
  Administered 2022-06-20: 650 mg via ORAL
  Filled 2022-06-20: qty 2

## 2022-06-20 NOTE — Discharge Instructions (Signed)
Please call office to change next appointment to telephone visit.  If you have questions or concerns please call the on call provider.  If you have urgent concerns please go to the nearest emergency department for further evaluation.

## 2022-06-20 NOTE — Final Progress Note (Signed)
Final Progress Note  Patient ID: Pamela Mosley MRN: [id number] DOB/AGE: 33-May-1992 33 y.o.  Admit date: 06/20/2022 Admitting provider: Mirna Mires, CNM Discharge date: 06/20/2022   Admission Diagnoses: lower abdominal pain [redacted] weeks gestation of pregnancy  Discharge Diagnoses:  Principal Problem:   Left lower quadrant abdominal pain affecting pregnancy in third trimester Active Problems:   Supervision of other normal pregnancy, antepartum  Reactive fetal heart tones Round ligament pain of pregnancy  History of Present Illness: The patient is a 31 y.o. female [id number] at [redacted]w[redacted]d who presents for  a c.o left lower abdominal pain that she felt this morning. It is intermittent and sharp, and runs along her left side. She denies contraction cramping, LOF or decreased fetal movement. No vaginal bleeding and no recent intercourse. She tested positive for COVID today..   Past Medical History:  Diagnosis Date   Miscarriage 2009; 2014, 2021   NO SURGERY   Scoliosis     Past Surgical History:  Procedure Laterality Date   BACK SURGERY  2003   for scoliosis   DILATION AND CURETTAGE OF UTERUS     WISDOM TOOTH EXTRACTION  2012   all four    No current facility-administered medications on file prior to encounter.   Current Outpatient Medications on File Prior to Encounter  Medication Sig Dispense Refill   Iron-FA-B Cmp-C-Biot-Probiotic (FUSION PLUS) CAPS Take 1 tablet by mouth daily. 30 capsule 11   Prenatal Vit-Fe Fumarate-FA (PRENATAL 19) tablet Chew 1 tablet by mouth daily.     ranitidine (ZANTAC) 75 MG tablet Take 75 mg by mouth at bedtime.     valACYclovir (VALTREX) 1000 MG tablet Take 500 mg by mouth 2 (two) times daily. As needed for suppression with outbreak     buPROPion (WELLBUTRIN SR) 150 MG 12 hr tablet Take 1 tablet (150 mg total) by mouth 2 (two) times daily. (Patient not taking: Reported on 06/20/2022) 60 tablet 3    Allergies  Allergen Reactions   Latex Rash     Social History   Socioeconomic History   Marital status: Married    Spouse name: Harden Mo   Number of children: 2   Years of education: 14   Highest education level: Not on file  Occupational History   Occupation: DENTAL ASSISTANT  Tobacco Use   Smoking status: Former    Types: Cigars   Smokeless tobacco: Never   Tobacco comments:    1 black and mild daily  Vaping Use   Vaping Use: Never used  Substance and Sexual Activity   Alcohol use: Not Currently    Comment: socially   Drug use: Not Currently    Frequency: 2.0 times per week    Types: Marijuana   Sexual activity: Yes    Birth control/protection: None  Other Topics Concern   Not on file  Social History Narrative   Not on file   Social Determinants of Health   Financial Resource Strain: Not on file  Food Insecurity: Not on file  Transportation Needs: Not on file  Physical Activity: Not on file  Stress: Not on file  Social Connections: Not on file  Intimate Partner Violence: Not on file    Family History  Problem Relation Age of Onset   Hypertension Mother    Kidney disease Mother    Stroke Mother    Alcohol abuse Mother    Drug abuse Mother    Osteoarthritis Mother    Aneurysm Mother    Migraines Mother  Drug abuse Father    Cancer Father    Hyperlipidemia Father    Hypertension Father    Gestational diabetes Sister    Heart attack Maternal Grandmother    Heart murmur Maternal Grandmother      Review of Systems  Constitutional: Negative.   HENT: Negative.    Eyes: Negative.   Respiratory: Negative.    Cardiovascular: Negative.   Gastrointestinal:  Positive for nausea.  Genitourinary: Negative.   Musculoskeletal: Negative.   Skin: Negative.   Neurological: Negative.   Endo/Heme/Allergies: Negative.   Psychiatric/Behavioral: Negative.       Physical Exam: BP 103/61 (BP Location: Left Arm)   Pulse (!) 125   Temp (!) 100.4 F (38 C) (Oral)   Resp 16   LMP 11/16/2021    SpO2 95%   Physical Exam Constitutional:      Appearance: Normal appearance. She is normal weight.  Genitourinary:     Vulva and rectum normal.     Genitourinary Comments: Cervical exam deferred.  Cardiovascular:     Rate and Rhythm: Normal rate and regular rhythm.     Pulses: Normal pulses.     Heart sounds: Normal heart sounds.  Pulmonary:     Effort: Pulmonary effort is normal.     Breath sounds: Normal breath sounds.  Abdominal:     Comments: Gravid uterus.  Musculoskeletal:        General: Normal range of motion.     Cervical back: Normal range of motion and neck supple.  Neurological:     General: No focal deficit present.     Mental Status: She is alert and oriented to person, place, and time.  Skin:    General: Skin is warm and dry.  Psychiatric:        Mood and Affect: Mood normal.        Behavior: Behavior normal.   FHTs; baseline is 150 with moderate variability and accels present. Decels absent. One isolated contraction noted while patient is talking. She appeared unaware of the UC.  Consults: None  Significant Findings/ Diagnostic Studies: labs:  Results for orders placed or performed during the hospital encounter of 06/20/22 (from the past 24 hour(s))  Urinalysis, Routine w reflex microscopic Urine, Clean Catch     Status: Abnormal   Collection Time: 06/20/22  3:46 PM  Result Value Ref Range   Color, Urine YELLOW (A) YELLOW   APPearance HAZY (A) CLEAR   Specific Gravity, Urine 1.010 1.005 - 1.030   pH 6.0 5.0 - 8.0   Glucose, UA NEGATIVE NEGATIVE mg/dL   Hgb urine dipstick NEGATIVE NEGATIVE   Bilirubin Urine NEGATIVE NEGATIVE   Ketones, ur 80 (A) NEGATIVE mg/dL   Protein, ur NEGATIVE NEGATIVE mg/dL   Nitrite NEGATIVE NEGATIVE   Leukocytes,Ua NEGATIVE NEGATIVE     Procedures: EFM NST Baseline FHR: 150 beats/min Variability: moderate Accelerations: present Decelerations: absent Tocometry: one UC seen.  Interpretation:  INDICATIONS: rule out  uterine contractions RESULTS:  A NST procedure was performed with FHR monitoring and a normal baseline established, appropriate time of 20-40 minutes of evaluation, and accels >2 seen w 15x15 characteristics.  Results show a REACTIVE NST.    Hospital Course: The patient was admitted to Labor and Delivery Triage for observation. A urine sample was sent based on her complaint to r/o UTI. She had a reactive tracing of FHTs. Tylenol was provided for her c/o mil left lower abdominal pain. Based on her description of the pain, with absence of  any regular contractions and a reassuring tracing, she was discharged home. She is instructed to drink Gatorade, rest and treat her SXS of COVID she will f/u at [facility] for her next visit. She is advised to isolate for 5 days.  Discharge Condition: good  Disposition: Discharge disposition: 01-Home or Self Care       Diet: Regular diet  Discharge Activity: Bedrest  Discharge Instructions     Notify physician for a general feeling that "something is not right"   Complete by: As directed    Notify physician for increase or change in vaginal discharge   Complete by: As directed    Notify physician for intestinal cramps, with or without diarrhea, sometimes described as "gas pain"   Complete by: As directed    Notify physician for leaking of fluid   Complete by: As directed    Notify physician for low, dull backache, unrelieved by heat or Tylenol   Complete by: As directed    Notify physician for menstrual like cramps   Complete by: As directed    Notify physician for pelvic pressure   Complete by: As directed    Notify physician for uterine contractions.  These may be painless and feel like the uterus is tightening or the baby is  "balling up"   Complete by: As directed    Notify physician for vaginal bleeding   Complete by: As directed    PRETERM LABOR:  Includes any of the follwing symptoms that occur between 20 - [redacted] weeks gestation.  If these  symptoms are not stopped, preterm labor can result in preterm delivery, placing your baby at risk   Complete by: As directed       Allergies as of 06/20/2022       Reactions   Latex Rash        Medication List     TAKE these medications    buPROPion 150 MG 12 hr tablet Commonly known as: Wellbutrin SR Take 1 tablet (150 mg total) by mouth 2 (two) times daily.   Fusion Plus Caps Take 1 tablet by mouth daily.   Prenatal 19 tablet Chew 1 tablet by mouth daily.   ranitidine 75 MG tablet Commonly known as: ZANTAC Take 75 mg by mouth at bedtime.   valACYclovir 1000 MG tablet Commonly known as: VALTREX Take 500 mg by mouth 2 (two) times daily. As needed for suppression with outbreak         Total time spent taking care of this patient: 30 minutes  Signed: Imagene Riches, CNM  06/20/2022, 5:33 PM

## 2022-06-20 NOTE — ED Notes (Signed)
When this RN triaged pt, pt was not here for her recent COVID test result but because she is having lower ABD pressure, pt denies bleeding.   OB called, pt going to OBS 2 and will see Dr. Logan Bores  5th pregnancy, 3rd baby, pt is 31 weeks at this time and states she is still feeling the baby move.

## 2022-06-20 NOTE — Discharge Summary (Signed)
   Please see Final Progress note.  Mirna Mires, CNM  06/20/2022 5:33 PM

## 2022-06-20 NOTE — OB Triage Note (Signed)
Patient to OBS 2 with complaint of overnight nasal congestion and shortness of breath.  Denies cough.  Home covid test positive.  Daughter had tested positive earlier this week. Patient reports positive fetal movements.  Lower abdominal pain/left groin that is described as intermittent "lightning crotch" that has become more constant.  Patient rates pain as 8/10.  Patient reports vomiting x1 hour on this past Thursday morning.

## 2022-06-23 ENCOUNTER — Encounter: Payer: Medicaid Other | Admitting: Obstetrics

## 2022-06-30 ENCOUNTER — Encounter: Payer: Medicaid Other | Admitting: Certified Nurse Midwife

## 2022-07-02 ENCOUNTER — Other Ambulatory Visit: Payer: Self-pay | Admitting: Obstetrics

## 2022-07-02 DIAGNOSIS — O43113 Circumvallate placenta, third trimester: Secondary | ICD-10-CM

## 2022-07-03 ENCOUNTER — Encounter: Payer: Self-pay | Admitting: Obstetrics

## 2022-07-03 ENCOUNTER — Ambulatory Visit (INDEPENDENT_AMBULATORY_CARE_PROVIDER_SITE_OTHER): Payer: Medicaid Other | Admitting: Obstetrics

## 2022-07-03 VITALS — BP 114/70 | HR 83 | Wt 133.8 lb

## 2022-07-03 DIAGNOSIS — Z3A32 32 weeks gestation of pregnancy: Secondary | ICD-10-CM

## 2022-07-03 LAB — POCT URINALYSIS DIPSTICK OB
Bilirubin, UA: NEGATIVE
Blood, UA: NEGATIVE
Glucose, UA: NEGATIVE
Ketones, UA: NEGATIVE
Leukocytes, UA: NEGATIVE
Nitrite, UA: NEGATIVE
POC,PROTEIN,UA: NEGATIVE
Spec Grav, UA: 1.025 (ref 1.010–1.025)
Urobilinogen, UA: 0.2 E.U./dL
pH, UA: 7.5 (ref 5.0–8.0)

## 2022-07-03 NOTE — Progress Notes (Signed)
ROB at [redacted]w[redacted]d. Active baby. Pamela Mosley has recovered from COVID. Having lots of back/body pain. Discussed yoga, swimming, belly band. Plans unmedicated birth. Reviewed comfort measures in labor at home, when to go to the hospital. Questions answered about placenta and delayed cord clamping. May want to take placenta home. Advised to bring a cooler. Growth scan next week. RTC in 2 weeks.  [name], CNM

## 2022-07-07 ENCOUNTER — Other Ambulatory Visit (INDEPENDENT_AMBULATORY_CARE_PROVIDER_SITE_OTHER): Payer: Medicaid Other

## 2022-07-07 DIAGNOSIS — O43113 Circumvallate placenta, third trimester: Secondary | ICD-10-CM | POA: Diagnosis not present

## 2022-07-07 DIAGNOSIS — Z3A33 33 weeks gestation of pregnancy: Secondary | ICD-10-CM | POA: Diagnosis not present

## 2022-07-15 ENCOUNTER — Encounter: Payer: Self-pay | Admitting: Obstetrics

## 2022-07-15 ENCOUNTER — Ambulatory Visit (INDEPENDENT_AMBULATORY_CARE_PROVIDER_SITE_OTHER): Payer: Medicaid Other | Admitting: Obstetrics

## 2022-07-15 VITALS — BP 109/69 | HR 70 | Wt 134.0 lb

## 2022-07-15 DIAGNOSIS — Z3483 Encounter for supervision of other normal pregnancy, third trimester: Secondary | ICD-10-CM

## 2022-07-15 DIAGNOSIS — Z3A36 36 weeks gestation of pregnancy: Secondary | ICD-10-CM

## 2022-07-15 DIAGNOSIS — B009 Herpesviral infection, unspecified: Secondary | ICD-10-CM

## 2022-07-15 MED ORDER — VALACYCLOVIR HCL 500 MG PO TABS: 500.0000 mg | ORAL_TABLET | Freq: Two times a day (BID) | ORAL | 1 refills | Status: AC

## 2022-07-15 NOTE — Progress Notes (Signed)
ROB at [redacted]w[redacted]d. Active baby. Prima is having occasional BH ctx about 3-4 times a week. Discussed plan for birth. Plans to labor at home as long as possible. Recommend doula. Desires a lotus birth (will cut cord ~30 min after birth). Discussed HSV suppression starting at 36 weeks. Rx sent to pharmacy. Discussed GBS and GC/chlamydia swabs at next visit. Growth scan on 9/19 WNL, 44th percentile. Repeat scan in 3-4 weeks. ROB in 2 weeks.  Lloyd Huger, CNM

## 2022-07-20 ENCOUNTER — Encounter: Payer: Self-pay | Admitting: Certified Nurse Midwife

## 2022-07-30 ENCOUNTER — Other Ambulatory Visit: Payer: Self-pay | Admitting: Certified Nurse Midwife

## 2022-07-30 ENCOUNTER — Encounter: Payer: Self-pay | Admitting: Certified Nurse Midwife

## 2022-07-30 ENCOUNTER — Other Ambulatory Visit (HOSPITAL_COMMUNITY)
Admission: RE | Admit: 2022-07-30 | Discharge: 2022-07-30 | Disposition: A | Discharge status: Home / Self Care | Payer: Medicaid Other | Source: Ambulatory Visit | Attending: Certified Nurse Midwife | Admitting: Certified Nurse Midwife

## 2022-07-30 ENCOUNTER — Ambulatory Visit (INDEPENDENT_AMBULATORY_CARE_PROVIDER_SITE_OTHER): Payer: Medicaid Other | Admitting: Certified Nurse Midwife

## 2022-07-30 VITALS — BP 113/74 | HR 73 | Wt 136.5 lb

## 2022-07-30 DIAGNOSIS — Z113 Encounter for screening for infections with a predominantly sexual mode of transmission: Secondary | ICD-10-CM | POA: Diagnosis present

## 2022-07-30 DIAGNOSIS — Z3483 Encounter for supervision of other normal pregnancy, third trimester: Secondary | ICD-10-CM

## 2022-07-30 DIAGNOSIS — Z3A36 36 weeks gestation of pregnancy: Secondary | ICD-10-CM

## 2022-07-30 DIAGNOSIS — Z3685 Encounter for antenatal screening for Streptococcus B: Secondary | ICD-10-CM

## 2022-07-30 LAB — POCT URINALYSIS DIPSTICK OB
Bilirubin, UA: NEGATIVE
Blood, UA: NEGATIVE
Glucose, UA: NEGATIVE
Ketones, UA: NEGATIVE
Leukocytes, UA: NEGATIVE
Nitrite, UA: NEGATIVE
POC,PROTEIN,UA: NEGATIVE
Spec Grav, UA: 1.01 (ref 1.010–1.025)
Urobilinogen, UA: 0.2 E.U./dL
pH, UA: 6.5 (ref 5.0–8.0)

## 2022-07-30 MED ORDER — CIMETIDINE 400 MG PO TABS: 400.0000 mg | ORAL_TABLET | Freq: Two times a day (BID) | ORAL | 1 refills | Status: DC

## 2022-07-30 MED ORDER — FAMOTIDINE 20 MG PO TABS: 20.0000 mg | ORAL_TABLET | Freq: Two times a day (BID) | ORAL | 1 refills | Status: DC

## 2022-07-30 NOTE — Patient Instructions (Signed)

## 2022-07-30 NOTE — Addendum Note (Signed)
Addended by: Hildred Priest on: 07/30/2022 01:36 PM   Modules accepted: Orders

## 2022-07-30 NOTE — Progress Notes (Addendum)
ROB doing well. GBS and cultures today. Herbal prep handout given. Discussed indications of labor to going to hospital.Pt c/o heart burn, state she has tried all the over the counter medications. Discussed self help measures . Switched for zantac to Pepcid . Orders placed. Labor precautions reviewed. Follow up 1 wk for ROB.   Philip Aspen, CNM

## 2022-07-31 LAB — CERVICOVAGINAL ANCILLARY ONLY
Chlamydia: NEGATIVE
Comment: NEGATIVE
Comment: NORMAL
Neisseria Gonorrhea: NEGATIVE

## 2022-08-02 LAB — CULTURE, BETA STREP (GROUP B ONLY): Strep Gp B Culture: POSITIVE — AB

## 2022-08-05 ENCOUNTER — Ambulatory Visit (INDEPENDENT_AMBULATORY_CARE_PROVIDER_SITE_OTHER): Payer: Medicaid Other

## 2022-08-05 ENCOUNTER — Other Ambulatory Visit: Payer: Self-pay

## 2022-08-05 DIAGNOSIS — B009 Herpesviral infection, unspecified: Secondary | ICD-10-CM

## 2022-08-05 DIAGNOSIS — Z3A36 36 weeks gestation of pregnancy: Secondary | ICD-10-CM | POA: Diagnosis not present

## 2022-08-05 DIAGNOSIS — Z362 Encounter for other antenatal screening follow-up: Secondary | ICD-10-CM | POA: Diagnosis not present

## 2022-08-07 ENCOUNTER — Ambulatory Visit (INDEPENDENT_AMBULATORY_CARE_PROVIDER_SITE_OTHER): Payer: Medicaid Other | Admitting: Licensed Practical Nurse

## 2022-08-07 ENCOUNTER — Encounter: Payer: Self-pay | Admitting: Licensed Practical Nurse

## 2022-08-07 VITALS — BP 115/76 | HR 73 | Wt 139.0 lb

## 2022-08-07 DIAGNOSIS — F4320 Adjustment disorder, unspecified: Secondary | ICD-10-CM

## 2022-08-07 DIAGNOSIS — Z3A37 37 weeks gestation of pregnancy: Secondary | ICD-10-CM

## 2022-08-07 DIAGNOSIS — O99333 Smoking (tobacco) complicating pregnancy, third trimester: Secondary | ICD-10-CM

## 2022-08-07 DIAGNOSIS — O09293 Supervision of pregnancy with other poor reproductive or obstetric history, third trimester: Secondary | ICD-10-CM

## 2022-08-07 DIAGNOSIS — Z3480 Encounter for supervision of other normal pregnancy, unspecified trimester: Secondary | ICD-10-CM

## 2022-08-07 DIAGNOSIS — F17210 Nicotine dependence, cigarettes, uncomplicated: Secondary | ICD-10-CM

## 2022-08-07 DIAGNOSIS — O99343 Other mental disorders complicating pregnancy, third trimester: Secondary | ICD-10-CM

## 2022-08-07 LAB — POCT URINALYSIS DIPSTICK
Glucose, UA: NEGATIVE
Ketones, UA: NEGATIVE
Leukocytes, UA: NEGATIVE
Protein, UA: NEGATIVE
Spec Grav, UA: 1.015 (ref 1.010–1.025)
Urobilinogen, UA: 1 E.U./dL
pH, UA: 6 (ref 5.0–8.0)

## 2022-08-07 NOTE — Progress Notes (Signed)
Routine Prenatal Care Visit  Subjective  Pamela Mosley is a 31 y.o. [id number] at [redacted]w[redacted]d being seen today for ongoing prenatal care.  She is currently monitored for the following issues for this low-risk pregnancy and has HSV-2 seropositive; Scoliosis; Adjustment disorder; History of abnormal cervical Pap smear; Smoker; Left lower quadrant abdominal pain affecting pregnancy in third trimester; and Supervision of other normal pregnancy, antepartum on their problem list.  ----------------------------------------------------------------------------------- Patient reports no complaints.  Here with Pamela Mosley.  Feels good. Has occasional ctx.  Would prefer to avoid induction. Reviewed recent US, growth normal but HC 5%. Pamela Mosley will be off for about 2 weeks and they have family nearby for [facility] support.   Contractions: Not present. Vag. Bleeding: None.  Movement: Present. Leaking Fluid denies.  ----------------------------------------------------------------------------------- The following portions of the patient's history were reviewed and updated as appropriate: allergies, current medications, past family history, past medical history, past social history, past surgical history and problem list. Problem list updated.  Objective  Blood pressure 115/76, pulse 73, weight 139 lb (63 kg), last menstrual period 11/16/2021. Pregravid weight 114 lb (51.7 kg) Total Weight Gain 25 lb (11.3 kg) Urinalysis: Urine Protein    Urine Glucose    Fetal Status: Fetal Heart Rate (bpm): 155 Fundal Height: 38 cm Movement: Present  Presentation: Vertex  General:  Alert, oriented and cooperative. Patient is in no acute distress.  Skin: Skin is warm and dry. No rash noted.   Cardiovascular: Normal heart rate noted  Respiratory: Normal respiratory effort, no problems with respiration noted  Abdomen: Soft, gravid, appropriate for gestational age. Pain/Pressure: Present     Pelvic:  Cervical exam performed Dilation: 2.5 Effacement  (%): 50 Station: -2  Extremities: Normal range of motion.  Edema: None  Mental Status: Normal mood and affect. Normal behavior. Normal judgment and thought content.   Assessment   31 y.o. [id number] at [redacted]w[redacted]d by  08/23/2022, Date entered prior to episode creation presenting for routine prenatal visit  Plan   g5 Problems (from 01/22/22 to present)     Problem Noted Resolved   Supervision of other normal pregnancy, antepartum 06/20/2022 by Imagene Riches, CNM No   Overview Signed 06/20/2022  3:29 PM by Imagene Riches, CNM     Nursing Staff Provider  Office Location  [facility] Dating    Language  English Anatomy US    Flu Vaccine   Genetic Screen  NIPS:   TDaP vaccine    Hgb A1C or  GTT Early : Third trimester :   Covid    LAB RESULTS   Rhogam   Blood Type A/Positive/-- (04/17 1408)   Feeding Plan  Antibody Negative (04/17 1408)  Contraception  Rubella 1.09 (04/17 1408)  Circumcision  RPR Non Reactive (08/16 0942)   Pediatrician   HBsAg Negative (04/17 1408)   Support Person  HIV Non Reactive (04/17 1408)  Prenatal Classes  Varicella     GBS  (For PCN allergy, check sensitivities)   BTL Consent     VBAC Consent  Pap      Hgb Electro      CF      SMA                    Term labor symptoms and general obstetric precautions including but not limited to vaginal bleeding, contractions, leaking of fluid and fetal movement were reviewed in detail with the patient. Please refer to After Visit Summary for other counseling recommendations.   Return  in about 1 week (around 08/14/2022) for ROB.  Carie Caddy, CNM  [place]ingo Pulse, [facility]  08/07/22  11:54 AM

## 2022-08-13 ENCOUNTER — Ambulatory Visit (INDEPENDENT_AMBULATORY_CARE_PROVIDER_SITE_OTHER): Payer: Medicaid Other | Admitting: Advanced Practice Midwife

## 2022-08-13 ENCOUNTER — Encounter: Payer: Self-pay | Admitting: Obstetrics and Gynecology

## 2022-08-13 VITALS — BP 111/74 | HR 80 | Wt 138.6 lb

## 2022-08-13 DIAGNOSIS — Z3483 Encounter for supervision of other normal pregnancy, third trimester: Secondary | ICD-10-CM

## 2022-08-13 DIAGNOSIS — Z3A38 38 weeks gestation of pregnancy: Secondary | ICD-10-CM

## 2022-08-13 LAB — POCT URINALYSIS DIPSTICK OB
Bilirubin, UA: NEGATIVE
Blood, UA: NEGATIVE
Glucose, UA: NEGATIVE
Ketones, UA: NEGATIVE
Leukocytes, UA: NEGATIVE
Nitrite, UA: NEGATIVE
POC,PROTEIN,UA: NEGATIVE
Spec Grav, UA: 1.015 (ref 1.010–1.025)
Urobilinogen, UA: 0.2 E.U./dL
pH, UA: 7 (ref 5.0–8.0)

## 2022-08-13 NOTE — Progress Notes (Signed)
ROB. Patient states daily fetal movement with pressure. She believes to be having braxton hicks contractions. She states questions related to L&D, answered. Patient states no questions or concerns at this time.

## 2022-08-13 NOTE — Progress Notes (Signed)
Routine Prenatal Care Visit  Subjective  Pamela Mosley is a 31 y.o. [id number] at [redacted]w[redacted]d being seen today for ongoing prenatal care.  She is currently monitored for the following issues for this low-risk pregnancy and has HSV-2 seropositive; Scoliosis; Adjustment disorder; History of abnormal cervical Pap smear; Smoker; Left lower quadrant abdominal pain affecting pregnancy in third trimester; Supervision of other normal pregnancy, antepartum; and H/O polymerase chain reaction DNA test positive for herpes simplex virus type 2 on their problem list.  ----------------------------------------------------------------------------------- Patient reports an increase in pelvic pressure and irregular contractions.   Contractions: Irregular. Vag. Bleeding: None.  Movement: Present. Leaking Fluid denies.  ----------------------------------------------------------------------------------- The following portions of the patient's history were reviewed and updated as appropriate: allergies, current medications, past family history, past medical history, past social history, past surgical history and problem list. Problem list updated.  Objective  Blood pressure 111/74, pulse 80, weight 138 lb 9.6 oz (62.9 kg), last menstrual period 11/16/2021. Pregravid weight 114 lb (51.7 kg) Total Weight Gain 24 lb 9.6 oz (11.2 kg) Urinalysis: Urine Protein Negative  Urine Glucose Negative  Fetal Status: Fetal Heart Rate (bpm): 124 Fundal Height: 38 cm Movement: Present  Presentation: Vertex  General:  Alert, oriented and cooperative. Patient is in no acute distress.  Skin: Skin is warm and dry. No rash noted.   Cardiovascular: Normal heart rate noted  Respiratory: Normal respiratory effort, no problems with respiration noted  Abdomen: Soft, gravid, appropriate for gestational age. Pain/Pressure: Present     Pelvic:  Cervical exam performed Dilation: 3 Effacement (%): 60 Station: -2  Extremities: Normal range of motion.   Edema: None  Mental Status: Normal mood and affect. Normal behavior. Normal judgment and thought content.   Assessment   31 y.o. [id number] at [redacted]w[redacted]d by  08/23/2022, Date entered prior to episode creation presenting for routine prenatal visit  Plan   g5 Problems (from 01/22/22 to present)    Problem Noted Resolved   Supervision of other normal pregnancy, antepartum 06/20/2022 by Imagene Riches, CNM No   Overview Signed 06/20/2022  3:29 PM by Imagene Riches, CNM     Nursing Staff Provider  Office Location  [facility] Dating    Language  English Anatomy US    Flu Vaccine   Genetic Screen  NIPS:   TDaP vaccine    Hgb A1C or  GTT Early : Third trimester :   Covid    LAB RESULTS   Rhogam   Blood Type A/Positive/-- (04/17 1408)   Feeding Plan  Antibody Negative (04/17 1408)  Contraception  Rubella 1.09 (04/17 1408)  Circumcision  RPR Non Reactive (08/16 0942)   Pediatrician   HBsAg Negative (04/17 1408)   Support Person  HIV Non Reactive (04/17 1408)  Prenatal Classes  Varicella     GBS  (For PCN allergy, check sensitivities)   BTL Consent     VBAC Consent  Pap      Hgb Electro      CF      SMA                   Term labor symptoms and general obstetric precautions including but not limited to vaginal bleeding, contractions, leaking of fluid and fetal movement were reviewed in detail with the patient. Please refer to After Visit Summary for other counseling recommendations.   Return in about 1 week (around 08/20/2022) for rob.  Rod Can, CNM 08/13/2022 12:50 PM

## 2022-08-13 NOTE — Progress Notes (Signed)
ROB

## 2022-08-15 ENCOUNTER — Observation Stay
Admission: EM | Admit: 2022-08-15 | Discharge: 2022-08-15 | Disposition: A | Discharge status: Home / Self Care | Payer: Medicaid Other | Attending: Obstetrics | Admitting: Obstetrics

## 2022-08-15 DIAGNOSIS — O26893 Other specified pregnancy related conditions, third trimester: Secondary | ICD-10-CM | POA: Diagnosis present

## 2022-08-15 DIAGNOSIS — N898 Other specified noninflammatory disorders of vagina: Secondary | ICD-10-CM | POA: Diagnosis present

## 2022-08-15 DIAGNOSIS — Z3A38 38 weeks gestation of pregnancy: Secondary | ICD-10-CM | POA: Diagnosis not present

## 2022-08-15 DIAGNOSIS — Z91040 Latex allergy status: Secondary | ICD-10-CM | POA: Insufficient documentation

## 2022-08-15 DIAGNOSIS — Z87891 Personal history of nicotine dependence: Secondary | ICD-10-CM | POA: Insufficient documentation

## 2022-08-15 DIAGNOSIS — Z3480 Encounter for supervision of other normal pregnancy, unspecified trimester: Secondary | ICD-10-CM

## 2022-08-15 LAB — RUPTURE OF MEMBRANE (ROM)PLUS: Rom Plus: NEGATIVE

## 2022-08-15 NOTE — Final Progress Note (Signed)
Final Progress Note  Patient ID: Pamela Mosley MRN: [id number] DOB/AGE: 1991/05/07 31 y.o.  Admit date: 08/15/2022 Admitting provider: Linzie Collin, MD Discharge date: 08/15/2022   Admission Diagnoses: vaginal discharge in pregnancy R/O SROM  Discharge Diagnoses:  Principal Problem:   Vaginal discharge in pregnancy in third trimester  Intact bag of waters Category 1 FHTS  History of Present Illness: The patient is a 31 y.o. female [id number] at [redacted]w[redacted]d who presents for  evaluation of possible LOF. She noted a smal "gush" of fluid this evening and wanted to check for SROM. She is not wearing a pad or towel in to the hosptial. Her perineum is dry upon her arrival to Labor and Delivery..   Past Medical History:  Diagnosis Date   Miscarriage 2009; 2014, 2021   NO SURGERY   Scoliosis     Past Surgical History:  Procedure Laterality Date   BACK SURGERY  2003   for scoliosis   DILATION AND CURETTAGE OF UTERUS     WISDOM TOOTH EXTRACTION  2012   all four    No current facility-administered medications on file prior to encounter.   Current Outpatient Medications on File Prior to Encounter  Medication Sig Dispense Refill   famotidine (PEPCID) 20 MG tablet TAKE 1 TABLET(20 MG) BY MOUTH TWICE DAILY 180 tablet 1   Prenatal Vit-Fe Fumarate-FA (PRENATAL 19) tablet Chew 1 tablet by mouth daily.     valACYclovir (VALTREX) 500 MG tablet Take 1 tablet (500 mg total) by mouth 2 (two) times daily. Start at 36 weeks 60 tablet 1   Iron-FA-B Cmp-C-Biot-Probiotic (FUSION PLUS) CAPS Take 1 tablet by mouth daily. (Patient not taking: Reported on 08/15/2022) 30 capsule 11   valACYclovir (VALTREX) 1000 MG tablet Take 500 mg by mouth 2 (two) times daily. As needed for suppression with outbreak (Patient not taking: Reported on 08/15/2022)      Allergies  Allergen Reactions   Latex Rash    Social History   Socioeconomic History   Marital status: Married    Spouse name: Harden Mo    Number of children: 2   Years of education: 14   Highest education level: Not on file  Occupational History   Occupation: DENTAL ASSISTANT  Tobacco Use   Smoking status: Former    Types: Cigars   Smokeless tobacco: Never   Tobacco comments:    1 black and mild daily  Vaping Use   Vaping Use: Never used  Substance and Sexual Activity   Alcohol use: Not Currently    Comment: socially   Drug use: Not Currently    Frequency: 2.0 times per week    Types: Marijuana   Sexual activity: Yes    Birth control/protection: None  Other Topics Concern   Not on file  Social History Narrative   Not on file   Social Determinants of Health   Financial Resource Strain: Not on file  Food Insecurity: Not on file  Transportation Needs: Not on file  Physical Activity: Not on file  Stress: Not on file  Social Connections: Not on file  Intimate Partner Violence: Not on file    Family History  Problem Relation Age of Onset   Hypertension Mother    Kidney disease Mother    Stroke Mother    Alcohol abuse Mother    Drug abuse Mother    Osteoarthritis Mother    Aneurysm Mother    Migraines Mother    Drug abuse Father    Cancer Father  Hyperlipidemia Father    Hypertension Father    Gestational diabetes Sister    Heart attack Maternal Grandmother    Heart murmur Maternal Grandmother      ROS   Physical Exam: LMP 11/16/2021   OBGyn Exam  Consults: None  Significant Findings/ Diagnostic Studies: labs:  Results for orders placed or performed during the hospital encounter of 08/15/22 (from the past 24 hour(s))  ROM Plus ([place] only)     Status: None   Collection Time: 08/15/22  8:11 PM  Result Value Ref Range   Rom Plus NEGATIVE      Procedures: EFM NST Baseline FHR: 120 beats/min Variability: moderate Accelerations: present Decelerations: absent Tocometry: rare BH UC  Interpretation:  INDICATIONS: rule out uterine contractions RESULTS:  A NST procedure was performed  with FHR monitoring and a normal baseline established, appropriate time of 20-40 minutes of evaluation, and accels >2 seen w 15x15 characteristics.  Results show a REACTIVE NST.    Hospital Course: The patient was admitted to Labor and Delivery Triage for observation. A ROM plus swab was sent to the lab. She was placed on the fetal monitor by the RN, and the FHTs demonstrated a reactive strip. Her cervix was checked and found to be 2 cms dilated. The ROM plus came back negative.she was not c/o regular contractions. Reassured that she is not ruptured, and that the [facility] are reactive, the patient is discharged home with plans for follow up at [facility] for her next [facility] appointment.  Discharge Condition: good  Disposition:  Discharge disposition: 01-Home or Self Care       Diet: Regular diet  Discharge Activity: Activity as tolerated  Discharge Instructions     Discharge activity:  No Restrictions   Complete by: As directed    Fetal Kick Count:  Lie on our left side for one hour after a meal, and count the number of times your baby kicks.  If it is less than 5 times, get up, move around and drink some juice.  Repeat the test 30 minutes later.  If it is still less than 5 kicks in an hour, notify your doctor.   Complete by: As directed    LABOR:  When conractions begin, you should start to time them from the beginning of one contraction to the beginning  of the next.  When contractions are 5 - 10 minutes apart or less and have been regular for at least an hour, you should call your health care provider.   Complete by: As directed    No sexual activity restrictions   Complete by: As directed    Notify physician for bleeding from the vagina   Complete by: As directed    Notify physician for blurring of vision or spots before the eyes   Complete by: As directed    Notify physician for chills or fever   Complete by: As directed    Notify physician for fainting spells, "black outs" or loss  of consciousness   Complete by: As directed    Notify physician for increase in vaginal discharge   Complete by: As directed    Notify physician for leaking of fluid   Complete by: As directed    Notify physician for pain or burning when urinating   Complete by: As directed    Notify physician for pelvic pressure (sudden increase)   Complete by: As directed    Notify physician for severe or continued nausea or vomiting   Complete  by: As directed    Notify physician for sudden gushing of fluid from the vagina (with or without continued leaking)   Complete by: As directed    Notify physician for sudden, constant, or occasional abdominal pain   Complete by: As directed    Notify physician if baby moving less than usual   Complete by: As directed       Allergies as of 08/15/2022       Reactions   Latex Rash        Medication List     TAKE these medications    famotidine 20 MG tablet Commonly known as: PEPCID TAKE 1 TABLET(20 MG) BY MOUTH TWICE DAILY   Fusion Plus Caps Take 1 tablet by mouth daily.   Prenatal 19 tablet Chew 1 tablet by mouth daily.   valACYclovir 1000 MG tablet Commonly known as: VALTREX Take 500 mg by mouth 2 (two) times daily. As needed for suppression with outbreak   valACYclovir 500 MG tablet Commonly known as: Valtrex Take 1 tablet (500 mg total) by mouth 2 (two) times daily. Start at 36 weeks        Follow-up Information     [facility]. Go to.   Contact information: [address] [place] [address] [phone]                Total time spent taking care of this patient: 30 minutes with chart review, ordering labs and evaluating the fetal monitor tracing and in documentation.  Signed: Mirna Mires, CNM  08/15/2022, 8:57 PM

## 2022-08-15 NOTE — Discharge Summary (Signed)
   Please see Final Progress Note.Imagene Riches, CNM  08/15/2022 8:55 PM

## 2022-08-15 NOTE — OB Triage Note (Signed)
Patient to OBS 3 with complaint of LOF and contractions.  Patient reports pain as 3-4/10.  Around 6pm patient reports she felt a gush that saturated her underpants and clothing.  Color of fluid wasn't noticed. Patient is GBS positive.

## 2022-08-15 NOTE — Progress Notes (Signed)
Pt discharged home with instructions to return for contractions 5 minutes apart or for rupture of membranes.

## 2022-08-17 ENCOUNTER — Encounter: Payer: Self-pay | Admitting: Obstetrics and Gynecology

## 2022-08-17 ENCOUNTER — Observation Stay
Admission: EM | Admit: 2022-08-17 | Discharge: 2022-08-17 | Disposition: A | Discharge status: Home / Self Care | Payer: Medicaid Other | Attending: Certified Nurse Midwife | Admitting: Certified Nurse Midwife

## 2022-08-17 ENCOUNTER — Other Ambulatory Visit: Payer: Self-pay

## 2022-08-17 DIAGNOSIS — R109 Unspecified abdominal pain: Secondary | ICD-10-CM

## 2022-08-17 DIAGNOSIS — Z3A39 39 weeks gestation of pregnancy: Secondary | ICD-10-CM | POA: Diagnosis not present

## 2022-08-17 DIAGNOSIS — O26893 Other specified pregnancy related conditions, third trimester: Secondary | ICD-10-CM

## 2022-08-17 DIAGNOSIS — Z3480 Encounter for supervision of other normal pregnancy, unspecified trimester: Secondary | ICD-10-CM

## 2022-08-17 DIAGNOSIS — Z79899 Other long term (current) drug therapy: Secondary | ICD-10-CM | POA: Insufficient documentation

## 2022-08-17 DIAGNOSIS — O471 False labor at or after 37 completed weeks of gestation: Principal | ICD-10-CM | POA: Insufficient documentation

## 2022-08-17 NOTE — OB Triage Note (Signed)
Pt G5P2 [redacted]w[redacted]d presents for increased ctx since 10am. Pt reports ctx every 5 minutes that she rates 7/10 in her lower abd and go around to her back. +FM. Denies bleeding/LOF. VSS. Monitors applied.

## 2022-08-17 NOTE — Progress Notes (Signed)
Pt given discharge instructions including follow up care and labor precautions. Pt verbalized understanding and discharged home in stable condition with husband.

## 2022-08-17 NOTE — OB Triage Note (Signed)
   L&D OB Triage Note  SUBJECTIVE Pamela Mosley is a 31 y.o. [id number] female at [redacted]w[redacted]d, EDD Estimated Date of Delivery: 08/23/22 who presented to triage with complaints of contractions. She denies LOF and vaginal bleeding   OB History  Gravida Para Term Preterm AB Living  5 2 2  0 2 2  SAB IAB Ectopic Multiple Live Births  2 0 0 0 2    # Outcome Date GA Lbr Len/2nd Weight Sex Delivery Anes PTL Lv  5 Current           4 Term 2020     Vag-Spont   LIV  3 Term 11/12/10 [redacted]w[redacted]d  2977 g F Vag-Spont   LIV     Name: Pamela Mosley  2 SAB           1 SAB             Medications Prior to Admission  Medication Sig Dispense Refill Last Dose   famotidine (PEPCID) 20 MG tablet TAKE 1 TABLET(20 MG) BY MOUTH TWICE DAILY 180 tablet 1 08/17/2022   Prenatal Vit-Fe Fumarate-FA (PRENATAL 19) tablet Chew 1 tablet by mouth daily.   Past Month   valACYclovir (VALTREX) 500 MG tablet Take 1 tablet (500 mg total) by mouth 2 (two) times daily. Start at 36 weeks 60 tablet 1 08/16/2022   Iron-FA-B Cmp-C-Biot-Probiotic (FUSION PLUS) CAPS Take 1 tablet by mouth daily. (Patient not taking: Reported on 08/15/2022) 30 capsule 11    valACYclovir (VALTREX) 1000 MG tablet Take 500 mg by mouth 2 (two) times daily. As needed for suppression with outbreak (Patient not taking: Reported on 08/15/2022)        OBJECTIVE  Nursing Evaluation:   BP (!) 101/56 (BP Location: Right Arm)   Pulse 82   Temp 98.2 F (36.8 C) (Oral)   Resp 16   Ht 5\' 2"  (1.575 m)   Wt 62.6 kg   LMP 11/16/2021   BMI 25.24 kg/m    Findings:   no cervical change from last visit in the office      NST was performed and has been reviewed by me.  NST INTERPRETATION: Category I  Mode: External Baseline Rate (A): 130 bpm Variability: Moderate Accelerations: 15 x 15 Decelerations: None     Contraction Frequency (min): occ  ASSESSMENT Impression:  1.  Pregnancy:  [id number] at [redacted]w[redacted]d , EDD Estimated Date of Delivery: 08/23/22 2.  Reassuring fetal  and maternal status 3.  Irregular contractions  PLAN 1. Current condition and above findings reviewed.  Reassuring fetal and maternal condition. 2. Discharge home with standard labor precautions given to return to L&D or call the office for problems. 3. Continue routine prenatal care.  Philip Aspen, CNM

## 2022-08-18 ENCOUNTER — Inpatient Hospital Stay: Payer: Medicaid Other | Admitting: Anesthesiology

## 2022-08-18 ENCOUNTER — Inpatient Hospital Stay
Admission: EM | Admit: 2022-08-18 | Discharge: 2022-08-19 | DRG: 806 | Disposition: A | Discharge status: Home / Self Care | Payer: Medicaid Other | Attending: Certified Nurse Midwife | Admitting: Certified Nurse Midwife

## 2022-08-18 ENCOUNTER — Other Ambulatory Visit: Payer: Self-pay

## 2022-08-18 ENCOUNTER — Encounter: Payer: Self-pay | Admitting: Certified Nurse Midwife

## 2022-08-18 DIAGNOSIS — Z87891 Personal history of nicotine dependence: Secondary | ICD-10-CM | POA: Diagnosis not present

## 2022-08-18 DIAGNOSIS — Z3A39 39 weeks gestation of pregnancy: Secondary | ICD-10-CM | POA: Diagnosis not present

## 2022-08-18 DIAGNOSIS — O43113 Circumvallate placenta, third trimester: Principal | ICD-10-CM | POA: Diagnosis present

## 2022-08-18 DIAGNOSIS — A6000 Herpesviral infection of urogenital system, unspecified: Secondary | ICD-10-CM | POA: Diagnosis present

## 2022-08-18 DIAGNOSIS — O9832 Other infections with a predominantly sexual mode of transmission complicating childbirth: Secondary | ICD-10-CM | POA: Diagnosis present

## 2022-08-18 DIAGNOSIS — O99824 Streptococcus B carrier state complicating childbirth: Secondary | ICD-10-CM | POA: Diagnosis present

## 2022-08-18 DIAGNOSIS — O26893 Other specified pregnancy related conditions, third trimester: Secondary | ICD-10-CM | POA: Diagnosis present

## 2022-08-18 LAB — RPR: RPR Ser Ql: NONREACTIVE

## 2022-08-18 LAB — CBC
HCT: 33.9 % — ABNORMAL LOW (ref 36.0–46.0)
Hemoglobin: 11.5 g/dL — ABNORMAL LOW (ref 12.0–15.0)
MCH: 30.3 pg (ref 26.0–34.0)
MCHC: 33.9 g/dL (ref 30.0–36.0)
MCV: 89.2 fL (ref 80.0–100.0)
Platelets: 206 10*3/uL (ref 150–400)
RBC: 3.8 MIL/uL — ABNORMAL LOW (ref 3.87–5.11)
RDW: 13.5 % (ref 11.5–15.5)
WBC: 11.3 10*3/uL — ABNORMAL HIGH (ref 4.0–10.5)
nRBC: 0 % (ref 0.0–0.2)

## 2022-08-18 LAB — TYPE AND SCREEN
ABO/RH(D): A POS
Antibody Screen: NEGATIVE

## 2022-08-18 MED ORDER — ACETAMINOPHEN 325 MG PO TABS
650.0000 mg | ORAL_TABLET | ORAL | Status: DC | PRN
  Administered 2022-08-18 – 2022-08-19 (×2): 650 mg via ORAL
  Filled 2022-08-18 (×2): qty 2

## 2022-08-18 MED ORDER — SENNOSIDES-DOCUSATE SODIUM 8.6-50 MG PO TABS
2.0000 | ORAL_TABLET | ORAL | Status: DC
  Administered 2022-08-18 – 2022-08-19 (×2): 2 via ORAL
  Filled 2022-08-18 (×2): qty 2

## 2022-08-18 MED ORDER — EPHEDRINE 5 MG/ML INJ: 10.0000 mg | INTRAVENOUS | Status: DC | PRN

## 2022-08-18 MED ORDER — LIDOCAINE-EPINEPHRINE (PF) 1.5 %-1:200000 IJ SOLN
INTRAMUSCULAR | Status: DC | PRN
  Administered 2022-08-18: 3 mL via EPIDURAL

## 2022-08-18 MED ORDER — DIPHENHYDRAMINE HCL 50 MG/ML IJ SOLN: 12.5000 mg | INTRAMUSCULAR | Status: DC | PRN

## 2022-08-18 MED ORDER — FENTANYL-BUPIVACAINE-NACL 0.5-0.125-0.9 MG/250ML-% EP SOLN
12.0000 mL/h | EPIDURAL | Status: DC | PRN
  Administered 2022-08-18: 12 mL/h via EPIDURAL

## 2022-08-18 MED ORDER — SOD CITRATE-CITRIC ACID 500-334 MG/5ML PO SOLN: 30.0000 mL | ORAL | Status: DC | PRN

## 2022-08-18 MED ORDER — PRENATAL MULTIVITAMIN CH
1.0000 | ORAL_TABLET | Freq: Every day | ORAL | Status: DC
  Administered 2022-08-18 – 2022-08-19 (×2): 1 via ORAL
  Filled 2022-08-18 (×2): qty 1

## 2022-08-18 MED ORDER — DOCUSATE SODIUM 100 MG PO CAPS
100.0000 mg | ORAL_CAPSULE | Freq: Two times a day (BID) | ORAL | Status: DC
  Administered 2022-08-19: 100 mg via ORAL
  Filled 2022-08-18: qty 1

## 2022-08-18 MED ORDER — BENZOCAINE-MENTHOL 20-0.5 % EX AERO
1.0000 | INHALATION_SPRAY | CUTANEOUS | Status: DC | PRN
  Filled 2022-08-18 (×2): qty 56

## 2022-08-18 MED ORDER — DIBUCAINE (PERIANAL) 1 % EX OINT
1.0000 | TOPICAL_OINTMENT | CUTANEOUS | Status: DC | PRN
  Filled 2022-08-18: qty 28

## 2022-08-18 MED ORDER — SODIUM CHLORIDE 0.9 % IV SOLN
INTRAVENOUS | Status: DC | PRN
  Administered 2022-08-18: 8 mL via EPIDURAL

## 2022-08-18 MED ORDER — LACTATED RINGERS IV SOLN
500.0000 mL | Freq: Once | INTRAVENOUS | Status: AC
  Administered 2022-08-18: 500 mL via INTRAVENOUS

## 2022-08-18 MED ORDER — FENTANYL CITRATE (PF) 100 MCG/2ML IJ SOLN: 50.0000 ug | INTRAMUSCULAR | Status: DC | PRN

## 2022-08-18 MED ORDER — OXYCODONE-ACETAMINOPHEN 5-325 MG PO TABS
1.0000 | ORAL_TABLET | ORAL | Status: DC | PRN
  Administered 2022-08-19: 1 via ORAL
  Filled 2022-08-18: qty 1

## 2022-08-18 MED ORDER — AMMONIA AROMATIC IN INHA
RESPIRATORY_TRACT | Status: AC
  Filled 2022-08-18: qty 10

## 2022-08-18 MED ORDER — OXYTOCIN 10 UNIT/ML IJ SOLN
INTRAMUSCULAR | Status: AC
  Filled 2022-08-18: qty 2

## 2022-08-18 MED ORDER — OXYTOCIN-SODIUM CHLORIDE 30-0.9 UT/500ML-% IV SOLN
2.5000 [IU]/h | INTRAVENOUS | Status: DC
  Administered 2022-08-18: 2.5 [IU]/h via INTRAVENOUS
  Filled 2022-08-18: qty 500

## 2022-08-18 MED ORDER — SODIUM CHLORIDE 0.9 % IV SOLN
1.0000 g | INTRAVENOUS | Status: DC
  Administered 2022-08-18: 1 g via INTRAVENOUS
  Filled 2022-08-18: qty 1000

## 2022-08-18 MED ORDER — LACTATED RINGERS IV SOLN: 500.0000 mL | INTRAVENOUS | Status: DC | PRN

## 2022-08-18 MED ORDER — TERBUTALINE SULFATE 1 MG/ML IJ SOLN: 0.2500 mg | Freq: Once | INTRAMUSCULAR | Status: DC | PRN

## 2022-08-18 MED ORDER — SIMETHICONE 80 MG PO CHEW: 80.0000 mg | CHEWABLE_TABLET | ORAL | Status: DC | PRN

## 2022-08-18 MED ORDER — METHYLERGONOVINE MALEATE 0.2 MG PO TABS: 0.2000 mg | ORAL_TABLET | ORAL | Status: DC | PRN

## 2022-08-18 MED ORDER — OXYTOCIN BOLUS FROM INFUSION
333.0000 mL | Freq: Once | INTRAVENOUS | Status: AC
  Administered 2022-08-18: 333 mL via INTRAVENOUS

## 2022-08-18 MED ORDER — LACTATED RINGERS IV SOLN: INTRAVENOUS | Status: DC

## 2022-08-18 MED ORDER — SODIUM CHLORIDE 0.9 % IV SOLN
2.0000 g | Freq: Once | INTRAVENOUS | Status: AC
  Administered 2022-08-18: 2 g via INTRAVENOUS
  Filled 2022-08-18: qty 2000

## 2022-08-18 MED ORDER — PHENYLEPHRINE 80 MCG/ML (10ML) SYRINGE FOR IV PUSH (FOR BLOOD PRESSURE SUPPORT): 80.0000 ug | PREFILLED_SYRINGE | INTRAVENOUS | Status: DC | PRN

## 2022-08-18 MED ORDER — COCONUT OIL OIL
1.0000 | TOPICAL_OIL | Status: DC | PRN
  Filled 2022-08-18: qty 7.5

## 2022-08-18 MED ORDER — ONDANSETRON HCL 4 MG PO TABS: 4.0000 mg | ORAL_TABLET | ORAL | Status: DC | PRN

## 2022-08-18 MED ORDER — WITCH HAZEL-GLYCERIN EX PADS
1.0000 | MEDICATED_PAD | CUTANEOUS | Status: DC | PRN
  Filled 2022-08-18: qty 100

## 2022-08-18 MED ORDER — ONDANSETRON HCL 4 MG/2ML IJ SOLN
4.0000 mg | Freq: Four times a day (QID) | INTRAMUSCULAR | Status: DC | PRN
  Administered 2022-08-18: 4 mg via INTRAVENOUS
  Filled 2022-08-18: qty 2

## 2022-08-18 MED ORDER — OXYTOCIN-SODIUM CHLORIDE 30-0.9 UT/500ML-% IV SOLN
1.0000 m[IU]/min | INTRAVENOUS | Status: DC
  Administered 2022-08-18: 4 m[IU]/min via INTRAVENOUS

## 2022-08-18 MED ORDER — ONDANSETRON HCL 4 MG/2ML IJ SOLN: 4.0000 mg | INTRAMUSCULAR | Status: DC | PRN

## 2022-08-18 MED ORDER — OXYCODONE-ACETAMINOPHEN 5-325 MG PO TABS: 2.0000 | ORAL_TABLET | ORAL | Status: DC | PRN

## 2022-08-18 MED ORDER — MISOPROSTOL 200 MCG PO TABS
ORAL_TABLET | ORAL | Status: AC
  Filled 2022-08-18: qty 4

## 2022-08-18 MED ORDER — LIDOCAINE HCL (PF) 1 % IJ SOLN
INTRAMUSCULAR | Status: AC
  Filled 2022-08-18: qty 30

## 2022-08-18 MED ORDER — LIDOCAINE HCL (PF) 1 % IJ SOLN
INTRAMUSCULAR | Status: DC | PRN
  Administered 2022-08-18: 2 mL via SUBCUTANEOUS
  Administered 2022-08-18 (×2): 3 mL via SUBCUTANEOUS

## 2022-08-18 MED ORDER — IBUPROFEN 600 MG PO TABS
600.0000 mg | ORAL_TABLET | Freq: Four times a day (QID) | ORAL | Status: DC
  Administered 2022-08-18 – 2022-08-19 (×5): 600 mg via ORAL
  Filled 2022-08-18 (×5): qty 1

## 2022-08-18 MED ORDER — LIDOCAINE HCL (PF) 1 % IJ SOLN: 30.0000 mL | INTRAMUSCULAR | Status: DC | PRN

## 2022-08-18 MED ORDER — FENTANYL-BUPIVACAINE-NACL 0.5-0.125-0.9 MG/250ML-% EP SOLN
EPIDURAL | Status: AC
  Filled 2022-08-18: qty 250

## 2022-08-18 MED ORDER — FERROUS SULFATE 325 (65 FE) MG PO TABS
325.0000 mg | ORAL_TABLET | Freq: Every day | ORAL | Status: DC
  Administered 2022-08-19: 325 mg via ORAL
  Filled 2022-08-18: qty 1

## 2022-08-18 MED ORDER — METHYLERGONOVINE MALEATE 0.2 MG/ML IJ SOLN: 0.2000 mg | INTRAMUSCULAR | Status: DC | PRN

## 2022-08-18 NOTE — OB Triage Note (Signed)
Patient is [redacted]w[redacted]d is primarily seen at [facility]. Patient was checked today and was 3cm according to patient. Patient is complaining of contractions every 3-5 minutes mild to moderate pain and membranes intact.

## 2022-08-18 NOTE — Anesthesia Preprocedure Evaluation (Signed)
Anesthesia Evaluation  Patient identified by MRN, date of birth, ID band Patient awake    Reviewed: Allergy & Precautions, NPO status , Patient's Chart, lab work & pertinent test results  History of Anesthesia Complications Negative for: history of anesthetic complications  Airway Mallampati: II  TM Distance: >3 FB Neck ROM: Full    Dental no notable dental hx. (+) Teeth Intact   Pulmonary neg pulmonary ROS, neg sleep apnea, neg COPD, Patient abstained from smoking.Not current smoker, former smoker,    Pulmonary exam normal breath sounds clear to auscultation       Cardiovascular Exercise Tolerance: Good METS(-) hypertension(-) CAD and (-) Past MI negative cardio ROS  (-) dysrhythmias  Rhythm:Regular Rate:Normal - Systolic murmurs    Neuro/Psych Large scoliosis repair/rods in back at 31 years old  Neuromuscular disease negative psych ROS   GI/Hepatic neg GERD  ,(+)     substance abuse  alcohol use and marijuana use, Two glasses of wine daily   Endo/Other  neg diabetes  Renal/GU negative Renal ROS     Musculoskeletal   Abdominal   Peds  Hematology   Anesthesia Other Findings Past Medical History: 2009; 2014, 2021: Miscarriage     Comment:  NO SURGERY No date: Scoliosis  Reproductive/Obstetrics (+) Pregnancy                             Anesthesia Physical Anesthesia Plan  ASA: 2  Anesthesia Plan: Epidural   Post-op Pain Management:    Induction:   PONV Risk Score and Plan: 2 and Treatment may vary due to age or medical condition and Ondansetron  Airway Management Planned: Natural Airway  Additional Equipment:   Intra-op Plan:   Post-operative Plan:   Informed Consent: I have reviewed the patients History and Physical, chart, labs and discussed the procedure including the risks, benefits and alternatives for the proposed anesthesia with the patient or authorized  representative who has indicated his/her understanding and acceptance.       Plan Discussed with: Surgeon  Anesthesia Plan Comments: (Discussed R/B/A of neuraxial anesthesia technique with patient: - rare risks of spinal/epidural hematoma, nerve damage, infection - Risk of PDPH - Risk of itching - Risk of nausea and vomiting - Risk of poor block necessitating replacement of epidural. - Risk of allergic reactions.  Discussed her being at higher risk for above complications and difficult epidural placement given patient's extensive scoliosis repair, which she understands. Of note, she does not recall any complications or trouble with her prior epidurals. Patient voiced understanding.)        Anesthesia Quick Evaluation

## 2022-08-18 NOTE — H&P (Signed)
History and Physical   HPI  Pamela Mosley is a 31 y.o. [id number] at [redacted]w[redacted]d Estimated Date of Delivery: 08/23/22 who is being admitted for labor management   OB History  OB History  Gravida Para Term Preterm AB Living  5 2 2  0 2 2  SAB IAB Ectopic Multiple Live Births  2 0 0 0 2    # Outcome Date GA Lbr Len/2nd Weight Sex Delivery Anes PTL Lv  5 Current           4 Term 2020     Vag-Spont   LIV  3 Term 11/12/10 [redacted]w[redacted]d  2977 g F Vag-Spont   LIV     Name: [name]  2 SAB           1 SAB             PROBLEM LIST  Pregnancy complications or risks: Patient Active Problem List   Diagnosis Date Noted   Labor and delivery, indication for care 08/17/2022   Vaginal discharge in pregnancy in third trimester 08/15/2022   Left lower quadrant abdominal pain affecting pregnancy in third trimester 06/20/2022   Supervision of other normal pregnancy, antepartum 06/20/2022   Smoker 07/25/2019   H/O polymerase chain reaction DNA test positive for herpes simplex virus type 2 03/31/2019   Adjustment disorder 11/29/2018   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   Scoliosis 09/05/2018   History of abnormal cervical Pap smear 09/01/2018    Prenatal labs and studies: ABO, Rh: --/--/A POS (10/31 0220) Antibody: NEG (10/31 0220) Rubella: 1.09 (04/17 1408) RPR: Non Reactive (08/16 0942)  HBsAg: Negative (04/17 1408)  HIV: Non Reactive (04/17 1408)  01-15-2000-- (10/12 0844)   Past Medical History:  Diagnosis Date   Miscarriage 2009; 2014, 2021   NO SURGERY   Scoliosis      Past Surgical History:  Procedure Laterality Date   BACK SURGERY  2003   for scoliosis   DILATION AND CURETTAGE OF UTERUS     WISDOM TOOTH EXTRACTION  2012   all four     Medications    Current Discharge Medication List     CONTINUE these medications which have NOT CHANGED   Details  famotidine (PEPCID) 20 MG tablet TAKE 1 TABLET(20 MG) BY MOUTH TWICE DAILY Qty: 180 tablet, Refills: 1   Comments:  **Patient requests 90 days supply**    Prenatal Vit-Fe Fumarate-FA (PRENATAL 19) tablet Chew 1 tablet by mouth daily.    valACYclovir (VALTREX) 500 MG tablet Take 1 tablet (500 mg total) by mouth 2 (two) times daily. Start at 36 weeks Qty: 60 tablet, Refills: 1         Allergies  Latex  Review of Systems  Constitutional: negative Eyes: negative Ears, nose, mouth, throat, and face: negative Respiratory: negative Cardiovascular: negative Gastrointestinal: negative Genitourinary:negative Integument/breast: negative Hematologic/lymphatic: negative Musculoskeletal:negative Neurological: negative Behavioral/Psych: negative Endocrine: negative Allergic/Immunologic: negative  Physical Exam  BP (!) 96/52   Pulse 83   Temp 98.5 F (36.9 C) (Oral)   Resp 18   Ht 5\' 2"  (1.575 m)   Wt 62 kg   LMP 11/16/2021   BMI 25.00 kg/m   Lungs:  CTA B Cardio: RRR without M/R/G Abd: Soft, gravid, NT Presentation: cephalic EXT: No C/C/ 1+ Edema DTRs: 2+ B CERVIX: Dilation: Lip/rim Effacement (%): 90 Cervical Position: Middle Station: Plus 1 Exam by:: 11/18/2021  See Prenatal records for more detailed PE.     FHR:  Baseline: 125 bpm, Variability: Good {>  6 bpm), Accelerations: Reactive, and Decelerations: Absent  Toco: Uterine Contractions: Frequency: Every 5-10 minutes  Test Results  Results for orders placed or performed during the hospital encounter of 08/18/22 (from the past 24 hour(s))  CBC     Status: Abnormal   Collection Time: 08/18/22  2:20 AM  Result Value Ref Range   WBC 11.3 (H) 4.0 - 10.5 K/uL   RBC 3.80 (L) 3.87 - 5.11 MIL/uL   Hemoglobin 11.5 (L) 12.0 - 15.0 g/dL   HCT 33.9 (L) 36.0 - 46.0 %   MCV 89.2 80.0 - 100.0 fL   MCH 30.3 26.0 - 34.0 pg   MCHC 33.9 30.0 - 36.0 g/dL   RDW 13.5 11.5 - 15.5 %   Platelets 206 150 - 400 K/uL   nRBC 0.0 0.0 - 0.2 %  Type and screen [place]     Status: None   Collection Time: 08/18/22  2:20  AM  Result Value Ref Range   ABO/RH(D) A POS    Antibody Screen NEG    Sample Expiration      08/21/2022,2359 Performed at [facility], [address] [place], [place] [address]    Group B Strep positive  Assessment   [id number] at [redacted]w[redacted]d Estimated Date of Delivery: 08/23/22  The fetus is reassuring.   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 Admit to L&D :   2. EFM:-- Category 1 3.IV pain medications or Epidural if desired.   4. Admission labs  5. Anticipate NSVD  Philip Aspen, CNM  08/18/2022

## 2022-08-18 NOTE — Progress Notes (Signed)
LABOR NOTE   Pamela Mosley 31 y.o.GP@ at [redacted]w[redacted]d  SUBJECTIVE:  Comfortable with epidural , feeling some pressure  Analgesia: Epidural  OBJECTIVE:  BP (!) 96/52   Pulse 83   Temp 98.5 F (36.9 C) (Oral)   Resp 18   Ht 5\' 2"  (1.575 m)   Wt 62 kg   LMP 11/16/2021   BMI 25.00 kg/m  No intake/output data recorded.  She has shown cervical change. CERVIX: 9.5:  100%:    :   soft SVE:   Dilation: Lip/rim Effacement (%): 90 Station: Plus 1 Exam by:: Katie RN CONTRACTIONS: regular, every 5-10 minutes FHR: Fetal heart tracing reviewed. Baseline: 110 bpm, Variability: Good {> 6 bpm), Accelerations: Reactive, and Decelerations: variables with contractions  Category II    Labs: Lab Results  Component Value Date   WBC 11.3 (H) 08/18/2022   HGB 11.5 (L) 08/18/2022   HCT 33.9 (L) 08/18/2022   MCV 89.2 08/18/2022   PLT 206 08/18/2022    ASSESSMENT: 1) Labor curve reviewed.       Progress: Active phase labor.     Membranes: ruptured            Principal Problem:   Labor and delivery, indication for care   PLAN: continue present management   Philip Aspen, CNM  08/18/2022 7:09 AM

## 2022-08-18 NOTE — Discharge Summary (Signed)
Postpartum Discharge Summary  Date of Service updated: 08/19/2022     Patient Name: Pamela Mosley DOB: 05-Dec-1990 MRN: [id number]  Date of admission: 08/18/2022 Delivery date:08/18/2022  Delivering provider: Philip Aspen  Date of discharge:   Admitting diagnosis: Labor and delivery, indication for care [O75.9] Intrauterine pregnancy: [redacted]w[redacted]d    Secondary diagnosis:  Principal Problem:   Labor and delivery, indication for care Active Problems:   Postpartum care following vaginal delivery   Encounter for care or examination of lactating mother  Additional problems: Circumvallate Placenta    Discharge diagnosis: Term Pregnancy Delivered                                              Post partum procedures: none Augmentation: Pitocin Complications: None  Hospital course: Onset of Labor With Vaginal Delivery      31y.o. yo [id number] [id number] admitted in Latent Labor on 08/18/2022. Labor course was uncomplicated. Membrane Rupture Time/Date: 3:45 AM ,08/18/2022  08/18/22, SROM 0345 clear Delivery Method:Vaginal, Spontaneous  Episiotomy: None  Lacerations:  1st degree;Perineal  Patient had a postpartum course complicated by NA.  She is ambulating, tolerating a regular diet, passing flatus, and urinating well. Patient is discharged home in stable condition on 08/19/22.  Newborn Data: Birth date:08/18/2022  Birth time:8:14 AM  Gender:Female    Living status:Living  Apgars:8 ,8  Weight:2930 g   Magnesium Sulfate received: No BMZ received: No Rhophylac:N/A MMR:No T-DaP:Given prenatally Flu: No Transfusion:No  Physical exam  Vitals:   08/18/22 1952 08/18/22 2320 08/19/22 0322 08/19/22 0754  BP: 125/74 (!) 95/58 95/60 104/73  Pulse: (!) 59 63 69 69  Resp: _0 Temp: 98.1 F (36.7 C) 98.4 F (36.9 C) 98.2 F (36.8 C) 97.8 F (36.6 C)  TempSrc: Oral Oral Oral Oral  SpO2: 100% 97% 97% 98%  Weight:      Height:       General: alert, cooperative, and no  distress Lochia: appropriate Uterine Fundus: firm Incision: N/A DVT Evaluation: No evidence of DVT seen on physical exam. Labs: Lab Results  Component Value Date   WBC 10.0 08/19/2022   HGB 10.5 (L) 08/19/2022   HCT 31.1 (L) 08/19/2022   MCV 91.5 08/19/2022   PLT 210 08/19/2022      Latest Ref Rng & Units 12/27/2021   10:42 AM  CMP  Glucose 70 - 99 mg/dL 75   BUN 6 - 20 mg/dL 5   Creatinine 0.44 - 1.00 mg/dL 0.57   Sodium 135 - 145 mmol/L 135   Potassium 3.5 - 5.1 mmol/L 3.9   Chloride 98 - 111 mmol/L 103   CO2 22 - 32 mmol/L 25   Calcium 8.9 - 10.3 mg/dL 9.3   Total Protein 6.5 - 8.1 g/dL 7.3   Total Bilirubin 0.3 - 1.2 mg/dL 0.5   Alkaline Phos 38 - 126 U/L 43   AST 15 - 41 U/L 28   ALT 0 - 44 U/L 25    Edinburgh Score:    08/18/2022    8:24 PM  Edinburgh Postnatal Depression Scale Screening Tool  I have been able to laugh and see the funny side of things. 0  I have looked forward with enjoyment to things. 0  I have blamed myself unnecessarily when things went wrong. 1  I have been anxious  or worried for no good reason. 1  I have felt scared or panicky for no good reason. 0  Things have been getting on top of me. 1  I have been so unhappy that I have had difficulty sleeping. 1  I have felt sad or miserable. 1  I have been so unhappy that I have been crying. 0  The thought of harming myself has occurred to me. 0  Edinburgh Postnatal Depression Scale Total 5      After visit meds:  Allergies as of 08/19/2022       Reactions   Latex Rash        Medication List     STOP taking these medications    famotidine 20 MG tablet Commonly known as: PEPCID       TAKE these medications    buPROPion 150 MG 24 hr tablet Commonly known as: WELLBUTRIN XL Take 150 mg by mouth every morning.   oxyCODONE 5 MG immediate release tablet Commonly known as: Roxicodone Take 1 tablet (5 mg total) by mouth every 8 (eight) hours as needed for up to 3 days for severe  pain.   Prenatal 19 tablet Chew 1 tablet by mouth daily.   valACYclovir 500 MG tablet Commonly known as: Valtrex Take 1 tablet (500 mg total) by mouth 2 (two) times daily. Start at 36 weeks         Discharge home in stable condition Infant Feeding: Breast Infant Disposition:home with mother Discharge instruction: per After Visit Summary and Postpartum booklet. Activity: Advance as tolerated. Pelvic rest for 6 weeks.  Diet: routine diet Anticipated Birth Control: Depo Postpartum Appointment: 2 wk/6 wk Additional Postpartum F/U:  none Future Appointments:  Future Appointments  Date Time Provider [place]  08/20/2022  8:15 AM Philip Aspen, CNM [facility] None   Follow up Visit:  Follow-up Information     Philip Aspen, CNM. Schedule an appointment as soon as possible for a visit.   Specialties: Certified Nurse Midwife, Radiology Why: 2 weeks video/telephone visit and 6 weeks in the office postpartum follow up Contact information: [place] [address] [address], [facility] 08/19/2022, 11:24 AM

## 2022-08-18 NOTE — Anesthesia Procedure Notes (Signed)
Epidural Patient location during procedure: OB  Staffing Anesthesiologist: Arita Miss, MD Performed: anesthesiologist   Preanesthetic Checklist Completed: patient identified, IV checked, site marked, risks and benefits discussed, surgical consent, monitors and equipment checked, pre-op evaluation and timeout performed  Epidural Patient position: sitting Prep: ChloraPrep Patient monitoring: heart rate, continuous pulse ox and blood pressure Approach: midline Location: L4-L5 Injection technique: LOR saline  Needle:  Needle type: Tuohy  Needle gauge: 17 G Needle length: 9 cm Needle insertion depth: 6 cm Catheter type: closed end flexible Catheter size: 19 Gauge Catheter at skin depth: 11 cm Test dose: negative and 1.5% lidocaine with Epi 1:200 K  Assessment Sensory level: T10 Events: blood not aspirated, injection not painful, no injection resistance, no paresthesia and negative IV test  Additional Notes first attempt. Difficult epidural placement with unsuccessful attempts at L3-4 and L2-3 levels, followed by success at L4-5 (below scar).  Pt. Evaluated and documentation done after procedure finished. Patient identified. Risks/Benefits/Options discussed with patient including but not limited to bleeding, infection, nerve damage, paralysis, failed block, incomplete pain control, headache, blood pressure changes, nausea, vomiting, reactions to medication both or allergic, itching and postpartum back pain. Confirmed with bedside nurse the patient's most recent platelet count. Confirmed with patient that they are not currently taking any anticoagulation, have any bleeding history or any family history of bleeding disorders. Patient expressed understanding and wished to proceed. All questions were answered. Sterile technique was used throughout the entire procedure. Please see nursing notes for vital signs. Test dose was given through epidural catheter and negative prior to continuing  to dose epidural or start infusion. Warning signs of high block given to the patient including shortness of breath, tingling/numbness in hands, complete motor block, or any concerning symptoms with instructions to call for help. Patient was given instructions on fall risk and not to get out of bed. All questions and concerns addressed with instructions to call with any issues or inadequate analgesia.     Patient tolerated the insertion well without immediate complications.  Reason for block: procedure for painReason for block:procedure for pain

## 2022-08-19 LAB — CBC
HCT: 31.1 % — ABNORMAL LOW (ref 36.0–46.0)
Hemoglobin: 10.5 g/dL — ABNORMAL LOW (ref 12.0–15.0)
MCH: 30.9 pg (ref 26.0–34.0)
MCHC: 33.8 g/dL (ref 30.0–36.0)
MCV: 91.5 fL (ref 80.0–100.0)
Platelets: 210 10*3/uL (ref 150–400)
RBC: 3.4 MIL/uL — ABNORMAL LOW (ref 3.87–5.11)
RDW: 13.4 % (ref 11.5–15.5)
WBC: 10 10*3/uL (ref 4.0–10.5)
nRBC: 0 % (ref 0.0–0.2)

## 2022-08-19 MED ORDER — OXYCODONE HCL 5 MG PO TABS: 5.0000 mg | ORAL_TABLET | Freq: Three times a day (TID) | ORAL | 0 refills | Status: AC | PRN

## 2022-08-19 NOTE — Lactation Note (Signed)
This note was copied from a baby's chart. Lactation Consultation Note  Patient Name: Pamela Mosley [id number] Date: 08/19/2022 Reason for consult: Follow-up assessment;Term Age:33 hours  Maternal Data Has patient been taught Hand Expression?: Yes Does the patient have breastfeeding experience prior to this delivery?: Yes How long did the patient breastfeed?: 18 months, 12 months  Mom has extensive BF history, no concerns voiced today.  Feeding Mother's Current Feeding Choice: Breast Milk  Baby has been breastfeeding well, void/stools exceed expectations, weight loss at 3%, passed all newborn screens.  LATCH Score  Lactation Tools Discussed/Used    Interventions Interventions: Breast feeding basics reviewed;Hand express;Pre-pump if needed;DEBP;Education;Pace feeding (Bottle introduction)  Reviewed cluster feeding, growth spurts, early cues/feeding on demand, softening of breast prior to latching to ensure deep latch, tips given for keeping baby awake/alert during feeding  Information given for timing of bottle introduction.Marland Kitchen  Discharge Discharge Education: Engorgement and breast care;Outpatient recommendation Pump: Hands Free Bernita Buffy; never used before)  Guidance given for management of breast fullness/engorgement and nipple care.  Brief review of general pump guidance, fitting of flanges, positioning of pump and appropriate suction levels. Encouraged to review manual for care instructions.  Consult Status Consult Status: Complete  Outpatient lactation number provided; encouraged to call for ongoing support as needed.  Lavonia Drafts 08/19/2022, 10:27 AM

## 2022-08-19 NOTE — Progress Notes (Signed)
Patient discharged. Discharge instructions given. Patient verbalizes understanding. Transported by axillary. 

## 2022-08-20 ENCOUNTER — Encounter: Payer: Medicaid Other | Admitting: Certified Nurse Midwife

## 2022-08-24 NOTE — Anesthesia Postprocedure Evaluation (Signed)
Anesthesia Post Note  Patient: Pamela Mosley  Procedure(s) Performed: AN AD HOC LABOR EPIDURAL  Anesthesia Type: Epidural Level of consciousness: awake and alert and awake Pain management: pain level controlled Vital Signs Assessment: post-procedure vital signs reviewed and stable Respiratory status: respiratory function stable Cardiovascular status: blood pressure returned to baseline Postop Assessment: no headache and no backache Anesthetic complications: no Comments: Patient discharged prior to post anesthetic evaluation. Reviewed nursing and physician notes, no apparent anesthetic complications.   No notable events documented.   Last Vitals: There were no vitals filed for this visit.  Last Pain: There were no vitals filed for this visit.               Arita Miss

## 2022-09-16 ENCOUNTER — Encounter: Payer: Self-pay | Admitting: Obstetrics

## 2022-09-16 ENCOUNTER — Ambulatory Visit (INDEPENDENT_AMBULATORY_CARE_PROVIDER_SITE_OTHER): Payer: Medicaid Other | Admitting: Obstetrics

## 2022-09-16 NOTE — Progress Notes (Signed)
Virtual Visit via Telephone Note  I connected with Pamela Mosley on 09/16/22 at 10:27 by telephone and verified that I am speaking with the correct person using two identifiers.  Location: Patient: home Provider: office   I discussed the limitations, risks, security and privacy concerns of performing an evaluation and management service by telephone and the availability of in person appointments. I also discussed with the patient that there may be a patient responsible charge related to this service. The patient expressed understanding and agreed to proceed.   History of Present Illness: Pamela Mosley is a 31 y.o. [phone] who is s/p NSVB of a 2930 gram female infant on 08/18/22. She had a 1st degree laceration. She is breastfeeding. She feels her recovery has been fast. Consider Depo for short-term birth control but would like a longer-term option.   Observations/Objective: Mood: Stable, feels less able to cope when she is not getting adequate sleep Breasts: some nipple pain and rough skin. Exclusively breastmilk feeding; has just introduced pump and bottle Bowel: Hemorrhoids finally starting to improve. Using Tucks, rx from hospital, and sitz baths Bladder: some urge incontinence Bleeding: just spotting now Perineum: stitches healing. Feels vaginal tone is taking longer to come back Infant: Growing, doing well  Assessment and Plan:  4 weeks postpartum Urinary incontinence Sleep disturbance  Discussed protected sleep, getting assistance with household chores Encouraged pelvic floor strengthening exercises, offered pelvic floor PT Considering long-term contraceptive options. Has had IUD twice, and each time it spontaneously expelled after a year or two. She plans Depo at her 6-week visit.  Follow Up Instructions: Schedule 6-week PP visit   I discussed the assessment and treatment plan with the patient. The patient was provided an opportunity to ask questions and all  were answered. The patient agreed with the plan and demonstrated an understanding of the instructions.   The patient was advised to call back or seek an in-person evaluation if the symptoms worsen or if the condition fails to improve as anticipated.  I provided 7 minutes of non-face-to-face time during this encounter.   Glenetta Borg, CNM

## 2022-10-01 ENCOUNTER — Encounter: Payer: Self-pay | Admitting: Certified Nurse Midwife

## 2022-10-01 ENCOUNTER — Ambulatory Visit (INDEPENDENT_AMBULATORY_CARE_PROVIDER_SITE_OTHER): Payer: Medicaid Other | Admitting: Certified Nurse Midwife

## 2022-10-01 DIAGNOSIS — R350 Frequency of micturition: Secondary | ICD-10-CM

## 2022-10-01 LAB — POCT URINALYSIS DIPSTICK
Bilirubin, UA: NEGATIVE
Blood, UA: NEGATIVE
Glucose, UA: NEGATIVE
Ketones, UA: NEGATIVE
Nitrite, UA: NEGATIVE
Protein, UA: NEGATIVE
Spec Grav, UA: 1.015 (ref 1.010–1.025)
Urobilinogen, UA: 0.2 E.U./dL
pH, UA: 6 (ref 5.0–8.0)

## 2022-10-01 MED ORDER — MEDROXYPROGESTERONE ACETATE 150 MG/ML IM SUSP: 150.0000 mg | INTRAMUSCULAR | 3 refills | Status: AC

## 2022-10-01 NOTE — Patient Instructions (Signed)

## 2022-10-01 NOTE — Progress Notes (Signed)
Subjective:    Pamela Mosley is a 31 y.o. [phone] African American female who presents for a postpartum visit. She is 6 weeks postpartum following a spontaneous vaginal delivery at 39.2 gestational weeks. Anesthesia: epidural. I have fully reviewed the prenatal and intrapartum course. Postpartum course has been normal. Baby's course has been normal. Baby is feeding by breast. Bleeding no bleeding. Bowel function is normal. Bladder function is normal. Patient is sexually active. Last sexual activity:3 days ago . Contraception method is Depo-Provera injections. Postpartum depression screening: negative. Score 3.  Last pap 02/12/22 and was negative.  The following portions of the patient's history were reviewed and updated as appropriate: allergies, current medications, past medical history, past surgical history and problem list.  Review of Systems Pertinent items are noted in HPI.   Vitals:   10/01/22 1117  BP: 112/79  Pulse: 84  Weight: 121 lb 14.4 oz (55.3 kg)   No LMP recorded.  Objective:   General:  alert, cooperative and no distress   Breasts:  deferred, no complaints  Lungs: clear to auscultation bilaterally  Heart:  regular rate and rhythm  Abdomen: soft, nontender   Vulva: normal  Vagina: normal vagina  Cervix:  closed  Corpus: Well-involuted  Adnexa:  Non-palpable  Rectal Exam: no hemorrhoids        Assessment:   Postpartum exam 6 wks s/p SVD Breastfeeding Depression screening Contraception counseling   Plan:  : Depo-Provera injections Follow up in: 10 days for 1st depo injection after negaitve UPT or earlier if needed  Doreene Burke,  CNM

## 2022-10-15 NOTE — Progress Notes (Cosign Needed)
    NURSE VISIT NOTE  Subjective:    Patient ID: Pamela Mosley, female    DOB: 11/16/1990, 31 y.o.   MRN: [id number]  HPI  Patient is a 31 y.o. [id number] female who presents for depo provera injection.   Objective:    There were no vitals taken for this visit.  Date last pap: 02/12/22 . Last Depo-Provera: per PCP "Follow up in: 10 days for 1st depo injection after negaitve UPT or earlier if needed" . Side Effects if any: none. Serum HCG indicated? NEG Depo-Provera 150 mg IM given by: Beverely Pace, CMA. Site: Right Deltoid  Lab Review  @THIS  VISIT ONLY@  Assessment:   No diagnosis found.   Plan:   Next appointment due between MARCH 16 and 30.    March 18, CMA

## 2022-10-16 ENCOUNTER — Ambulatory Visit (INDEPENDENT_AMBULATORY_CARE_PROVIDER_SITE_OTHER): Payer: Medicaid Other

## 2022-10-16 VITALS — BP 117/77 | HR 68 | Ht 62.0 in | Wt 122.0 lb

## 2022-10-16 DIAGNOSIS — Z3042 Encounter for surveillance of injectable contraceptive: Secondary | ICD-10-CM | POA: Diagnosis not present

## 2022-10-16 DIAGNOSIS — Z3202 Encounter for pregnancy test, result negative: Secondary | ICD-10-CM

## 2022-10-16 LAB — POCT URINE PREGNANCY: Preg Test, Ur: NEGATIVE

## 2022-10-16 MED ORDER — MEDROXYPROGESTERONE ACETATE 150 MG/ML IM SUSP
150.0000 mg | Freq: Once | INTRAMUSCULAR | Status: AC
  Administered 2022-10-16: 150 mg via INTRAMUSCULAR

## 2022-11-19 ENCOUNTER — Encounter: Payer: Self-pay | Admitting: Certified Nurse Midwife

## 2022-11-23 ENCOUNTER — Telehealth: Payer: Self-pay | Admitting: Certified Nurse Midwife

## 2022-11-23 NOTE — Telephone Encounter (Signed)
Left message for patient to call office back to give fax number so that we could get her FMLA paperwork faxed.

## 2023-01-08 ENCOUNTER — Ambulatory Visit (INDEPENDENT_AMBULATORY_CARE_PROVIDER_SITE_OTHER): Payer: Medicaid Other

## 2023-01-08 VITALS — BP 108/75 | HR 87 | Ht 62.0 in | Wt 113.0 lb

## 2023-01-08 DIAGNOSIS — Z3042 Encounter for surveillance of injectable contraceptive: Secondary | ICD-10-CM

## 2023-01-08 MED ORDER — MEDROXYPROGESTERONE ACETATE 150 MG/ML IM SUSP
150.0000 mg | Freq: Once | INTRAMUSCULAR | Status: AC
  Administered 2023-01-08: 150 mg via INTRAMUSCULAR

## 2023-01-08 NOTE — Progress Notes (Addendum)
    NURSE VISIT NOTE  Subjective:    Patient ID: Pamela Mosley, female    DOB: Sep 28, 1991, 32 y.o.   MRN: [id number]  HPI  Patient is a 32 y.o. [id number] female who presents for depo provera injection.   Objective:    BP 108/75   Pulse 87   Ht 5\' 2"  (1.575 m)   Wt 113 lb (51.3 kg)   BMI 20.67 kg/m   Last Annual: 02/12/22 nob visit Last pap: 02/12/22. Last Depo-Provera: 10/16/22. Side Effects if any: none. Serum HCG indicated? No . Depo-Provera 150 mg IM given by: Quintella Baton, CMA. Site: Right Deltoid  Lab Review  @THIS  VISIT ONLY@  Assessment:   1. Encounter for management and injection of depo-Provera      Plan:  Pt sent to schedule Annual. Next appointment due between June 7th and June 21.    Quintella Baton, CMA

## 2023-01-08 NOTE — Patient Instructions (Signed)
Contraceptive Injection A contraceptive injection is a shot that prevents pregnancy. It is also called a birth control shot. The shot contains the hormone progestin, which prevents pregnancy by: Stopping the ovaries from releasing eggs. Thickening cervical mucus to prevent sperm from entering the cervix. Thinning the lining of the uterus to prevent a fertilized egg from attaching to the uterus. Contraceptive injections are given under the skin (subcutaneous) or into a muscle (intramuscular). For these shots to work, you must get one of them every 3 months (12-13 weeks) from a health care provider. Tell a health care provider about: Any allergies you have. All medicines you are taking, including vitamins, herbs, eye drops, creams, and over-the-counter medicines. Any blood disorders you have. Any medical conditions you have. Whether you are pregnant or may be pregnant. What are the risks? Generally, this is a safe procedure. However, problems may occur, including: Mood changes or depression. Loss of bone density (osteoporosis) after long-term use. Blood clots. This is rare. Higher risk of an egg being fertilized outside your uterus (ectopic pregnancy).This is rare. What happens before the procedure? Your health care provider may do a routine physical exam. You may have a test to make sure you are not pregnant. What happens during the procedure?  The area where the shot will be given will be cleaned and sanitized with alcohol. A needle will be inserted into a muscle in your upper arm or buttock, or into the skin of your thigh or abdomen. The needle will be attached to a syringe with the medicine inside of it. The medicine will be pushed through the syringe and injected into your body. A small bandage (dressing) may be placed over the injection site. What can I expect after the procedure? After the procedure, it is common to have: Soreness around the injection site for a couple of  days. Irregular menstrual bleeding. Weight gain. Breast tenderness. Headaches. Discomfort in your abdomen. Ask your health care provider whether you need to use an added method of birth control (backup contraception), such as a condom, sponge, or spermicide. If the first shot is given 1-7 days after the start of your last menstrual period, you will not need backup contraception. If the first shot is given at any other time during your menstrual cycle, you should avoid having sex. If you do have sex, you will need to use backup contraception for 7 days after you receive the shot. Follow these instructions at home: General instructions Take over-the-counter and prescription medicines only as told by your health care provider. Do not rub or massage the injection site. Track your menstrual periods so you will know if they become irregular. Always use a condom to protect against sexually transmitted infections (STIs). Make sure you schedule an appointment in time for your next shot and mark it on your calendar. You must get an injection every 3 months (12-13 weeks) to prevent pregnancy. Lifestyle Do not use any products that contain nicotine or tobacco. These products include cigarettes, chewing tobacco, and vaping devices, such as e-cigarettes. If you need help quitting, ask your health care provider. Eat foods that are high in calcium and vitamin D, such as milk, cheese, and salmon. Doing this may help with any loss in bone density caused by the contraceptive injection. Ask your health care provider for dietary recommendations. Contact a health care provider if you: Have nausea or vomiting. Have abnormal vaginal discharge or bleeding. Miss a menstrual period or think you might be pregnant. Experience mood changes   or depression. Feel dizzy or light-headed. Have leg pain. Get help right away if you: Have chest pain or cough up blood. Have shortness of breath. Have a severe headache that does  not go away. Have numbness in any part of your body. Have slurred speech or vision problems. Have vaginal bleeding that is abnormally heavy or does not stop, or you have severe pain in your abdomen. Have depression that does not get better with treatment. If you ever feel like you may hurt yourself or others, or have thoughts about taking your own life, get help right away. Go to your nearest emergency department or: Call your local emergency services (911 in the [place] Call a suicide crisis helpline, such as the National Suicide Prevention Lifeline at 1-800-273-8255 or 988 in the [place] This is open 24 hours a day in the [place] Text the Crisis Text Line at 741741 (in the [place] Summary A contraceptive injection is a shot that prevents pregnancy. It is also called the birth control shot. The shot is given under the skin (subcutaneous) or into a muscle (intramuscular). After this procedure, it is common to have soreness around the injection site for a couple of days. To prevent pregnancy, the shot must be given by a health care provider every 3 months (12-13 weeks). After you have the shot, ask your health care provider whether you need to use an added method of birth control (backup contraception), such as a condom, sponge, or spermicide. This information is not intended to replace advice given to you by your health care provider. Make sure you discuss any questions you have with your health care provider. Document Revised: 04/30/2021 Document Reviewed: 04/15/2020 Elsevier Patient Education  2023 Elsevier Inc.  

## 2023-04-15 ENCOUNTER — Ambulatory Visit (INDEPENDENT_AMBULATORY_CARE_PROVIDER_SITE_OTHER): Payer: Medicaid Other

## 2023-04-15 VITALS — BP 118/80 | HR 76 | Wt 112.8 lb

## 2023-04-15 DIAGNOSIS — Z3202 Encounter for pregnancy test, result negative: Secondary | ICD-10-CM

## 2023-04-15 DIAGNOSIS — Z3042 Encounter for surveillance of injectable contraceptive: Secondary | ICD-10-CM

## 2023-04-15 LAB — POCT URINE PREGNANCY: Preg Test, Ur: NEGATIVE

## 2023-04-15 MED ORDER — MEDROXYPROGESTERONE ACETATE 150 MG/ML IM SUSP
150.0000 mg | Freq: Once | INTRAMUSCULAR | Status: AC
  Administered 2023-04-15: 150 mg via INTRAMUSCULAR

## 2023-04-15 NOTE — Patient Instructions (Signed)

## 2023-04-15 NOTE — Progress Notes (Signed)
    NURSE VISIT NOTE  Subjective:    Patient ID: Pamela Mosley, female    DOB: 09/17/1991, 32 y.o.   MRN: [id number]  HPI  Patient is a 32 y.o. [id number] female who presents for depo provera injection.   Objective:    BP 118/80   Pulse 76   Wt 112 lb 12.8 oz (51.2 kg)   Breastfeeding No   BMI 20.63 kg/m   Last Annual: N/A. Last pap: 02/12/2022. Last Depo-Provera: 01/08/2023. Patient should have returned back between the dates of 6/7-6/21/24 Side Effects if any: none. Serum HCG indicated? No , will collect POCT URINE HCG today  Depo-Provera 150 mg IM given by: Sheliah Hatch, CMA. Site: Left Deltoid  Lab Review  POCT HCG-NEGATIVE  Assessment:   1. Encounter for management and injection of depo-Provera      Plan:   Next appointment due between 07/01/23 and 07/15/23.    Fonda Kinder, CMA

## 2023-06-30 NOTE — Patient Instructions (Signed)

## 2023-06-30 NOTE — Progress Notes (Unsigned)
    NURSE VISIT NOTE  Subjective:    Patient ID: Pamela Mosley, female    DOB: [birth year], 32 y.o.   MRN: [id number]  HPI  Patient is a 32 y.o. [id number] female who presents for depo provera injection.   Objective:    There were no vitals taken for this visit.  Last Annual: ***. Last pap: ***. Last Depo-Provera: ***. Side Effects if any: {NONE:21772}***. Serum HCG indicated? {YES/NO:21197}. Depo-Provera 150 mg IM given by: {AOB Clinical WNUUV:25366}. Site: {AOB INJ [id number]  Lab Review  @THIS  VISIT ONLY@  Assessment:   No diagnosis found.   Plan:   Next appointment due between *** and ***.    Tommie Raymond, CMA

## 2023-07-01 ENCOUNTER — Ambulatory Visit (INDEPENDENT_AMBULATORY_CARE_PROVIDER_SITE_OTHER): Payer: Medicaid Other

## 2023-07-01 VITALS — BP 120/88 | HR 87 | Ht 62.0 in | Wt 114.0 lb

## 2023-07-01 DIAGNOSIS — Z3042 Encounter for surveillance of injectable contraceptive: Secondary | ICD-10-CM | POA: Diagnosis not present

## 2023-07-01 MED ORDER — MEDROXYPROGESTERONE ACETATE 150 MG/ML IM SUSP
150.0000 mg | Freq: Once | INTRAMUSCULAR | Status: AC
  Administered 2023-07-01: 150 mg via INTRAMUSCULAR

## 2023-07-08 ENCOUNTER — Encounter: Payer: Self-pay | Admitting: Obstetrics

## 2023-07-08 ENCOUNTER — Ambulatory Visit (INDEPENDENT_AMBULATORY_CARE_PROVIDER_SITE_OTHER): Payer: Medicaid Other | Admitting: Obstetrics

## 2023-07-08 VITALS — BP 107/78 | HR 96 | Ht 62.0 in | Wt 111.5 lb

## 2023-07-08 DIAGNOSIS — Z01419 Encounter for gynecological examination (general) (routine) without abnormal findings: Secondary | ICD-10-CM

## 2023-07-08 DIAGNOSIS — G8929 Other chronic pain: Secondary | ICD-10-CM

## 2023-07-08 DIAGNOSIS — N941 Unspecified dyspareunia: Secondary | ICD-10-CM

## 2023-07-08 MED ORDER — ESTRADIOL 0.1 MG/GM VA CREA: 0.2500 | TOPICAL_CREAM | Freq: Every day | VAGINAL | 3 refills | Status: DC

## 2023-07-08 NOTE — Progress Notes (Signed)
ANNUAL GYNECOLOGICAL EXAM  SUBJECTIVE  HPI  Pamela Mosley is a 32 y.o.-year-old [id number] who presents for an annual gynecological exam today.  She denies pelvic pain, abnormal vaginal bleeding or discharge, and UTI symptoms. She reports dyspareunia with penetration since the birth of her youngest child. She lost her mother and grandmother unexpectedly over the past year. She stopped drinking 3 months ago and is interested in quitting smoking. She smokes ~5 black & milds/week. She has a history of scoliosis with surgically placed rods and has been experiencing increased back pain recently. She is currently breastfeeding. She uses Depo for contraception and is happy with this method. She has noticed increasing irritability and frustration, and she has minimal time to herself without responsibilities.  Medical/Surgical History Past Medical History:  Diagnosis Date   Miscarriage 2009; 2014, 2021   NO SURGERY   Scoliosis    Past Surgical History:  Procedure Laterality Date   BACK SURGERY  2003   for scoliosis   DILATION AND CURETTAGE OF UTERUS     WISDOM TOOTH EXTRACTION  2012   all four    Social History Lives with husband and children. Feels safe there. Work: works from home, Aeronautical engineer Exercise: none Substances: Recently stopped EtOH, 5 cigars/week, denies vape & recreational drugs  Obstetric History OB History     Gravida  5   Para  3   Term  3   Preterm      AB  2   Living  3      SAB  2   IAB      Ectopic      Multiple  0   Live Births  3            GYN/Menstrual History No LMP recorded. Patient has had an injection. No periods with Depo and BF Last Pap: 01/2022 - NILM, negative HPV Contraception: Depo  Prevention Dentist: regular visits Eye exam: normal exam last year. Encouraged blue light lenses Mammogram: at 38 Colonoscopy: at 43 Flu shot/vaccines: declines flu vaccine  Current Medications Outpatient Medications Prior  to Visit  Medication Sig   medroxyPROGESTERone (DEPO-PROVERA) 150 MG/ML injection Inject 1 mL (150 mg total) into the muscle every 3 (three) months.   Prenatal Vit-Fe Fumarate-FA (PRENATAL 19) tablet Chew 1 tablet by mouth daily.   valACYclovir (VALTREX) 500 MG tablet Take 1 tablet (500 mg total) by mouth 2 (two) times daily. Start at 36 weeks   No facility-administered medications prior to visit.      Upstream - 07/08/23 0839       Pregnancy Intention Screening   Does the patient want to become pregnant in the next year? No    Does the patient's partner want to become pregnant in the next year? No    Would the patient like to discuss contraceptive options today? No      Contraception Wrap Up   Current Method Hormonal Injection    End Method Hormonal Injection    Contraception Counseling Provided No    How was the end contraceptive method provided? N/A            The pregnancy intention screening data noted above was reviewed. Potential methods of contraception were discussed. The patient elected to proceed with Hormonal Injection.   ROS Constitutional: Denied constitutional symptoms, night sweats, recent illness, fatigue, fever, insomnia and weight loss.  Eyes: Denied eye symptoms, eye pain, photophobia, vision change and visual disturbance.  Ears/Nose/Throat/Neck: Denied ear, nose, throat  or neck symptoms, hearing loss, nasal discharge, sinus congestion and sore throat.  Cardiovascular: Denied cardiovascular symptoms, arrhythmia, chest pain/pressure, edema, exercise intolerance, orthopnea and palpitations.  Respiratory: Denied pulmonary symptoms, asthma, pleuritic pain, productive sputum, cough, dyspnea and wheezing.  Gastrointestinal: Denied, gastro-esophageal reflux, melena, nausea and vomiting.  Genitourinary: See HPI  Musculoskeletal: Denied musculoskeletal symptoms, stiffness, swelling, muscle weakness and myalgia.  Dermatologic: Denied dermatology symptoms, rash and  scar.  Neurologic: Denied neurology symptoms, dizziness, headache, neck pain and syncope.  Psychiatric: Denied psychiatric symptoms, anxiety and depression. Grief, irritability  Endocrine: Denied endocrine symptoms including hot flashes and night sweats.    OBJECTIVE  Breastfeeding Yes    Physical examination General NAD, Conversant  HEENT Atraumatic; Op clear with mmm.  Normo-cephalic. Pupils reactive. Anicteric sclerae  Thyroid/Neck Smooth without nodularity or enlargement. Normal ROM.  Neck Supple.  Skin No rashes, lesions or ulceration. Normal palpated skin turgor. No nodularity.  Breasts: No masses or discharge.  Symmetric.  No axillary adenopathy.  Lungs: Clear to auscultation.No rales or wheezes. Normal Respiratory effort, no retractions.  Heart: NSR.  No murmurs or rubs appreciated. No peripheral edema  Abdomen: Soft.  Non-tender.  No masses.  No HSM. No hernia  Extremities: Moves all appropriately.  Normal ROM for age. No lymphadenopathy.  Neuro: Oriented to PPT.  Normal mood. Normal affect.     Pelvic: Declined    ASSESSMENT  1) Annual exam 2) Complicated grief 3) Desires smoking cessation 4) Back pain, h/o scoliosis 5) Dyspareunia  PLAN 1) Physical exam as noted. Discussed healthy lifestyle choices and preventive care. Declines STI testing and routine labs. Pap due 01/2027. 2) Discussed making time for herself in her routine, asking partner to take on some childcare responsibilities. Information on [facility] grief counseling sent via MyChart. 3) She has tried Wellbutrin and nicotine patches in the past, but the amount she smokes is so little those are not helpful. Discussed lifestyle modifications, meditations, other forms of stress relief.  4) Referral sent to orthopedics - not a candidate for chiropractic care 5) Rx for estrogen cream sent. Reviewed proper use.  Return for Depo injections and in one year for annual exam or as needed for  concerns.   [name], CNM

## 2023-07-12 ENCOUNTER — Telehealth: Payer: Self-pay

## 2023-07-12 ENCOUNTER — Other Ambulatory Visit: Payer: Self-pay | Admitting: Obstetrics

## 2023-07-12 MED ORDER — PREMARIN 0.625 MG/GM VA CREA: 0.5000 g | TOPICAL_CREAM | Freq: Every day | VAGINAL | 12 refills | Status: AC

## 2023-07-12 NOTE — Progress Notes (Signed)
Estrace not covered by [employer]. Rx changed to Premarin.  Glenetta Borg, CNM

## 2023-07-22 NOTE — Telephone Encounter (Signed)
conjugated estrogens (PREMARIN) vaginal cream Medication Date: 07/12/2023 Department: [facility] at [facility] Ordering/Authorizing: Glenetta Borg, CNM   Order Providers  Prescribing Provider Encounter Provider  Glenetta Borg, CNM Glenetta Borg, CNM   Outpatient Medication Detail   Disp Refills Start End   conjugated estrogens (PREMARIN) vaginal cream 42.5 g 12 07/12/2023 --   Sig - Route: Place 0.25 Applicatorfuls vaginally daily. Apply nightly for 2 weeks, then twice a week as needed - Vaginal   Sent to pharmacy as: conjugated estrogens (PREMARIN) vaginal cream   E-Prescribing Status: Receipt confirmed by pharmacy (07/12/2023  3:21 PM EDT)    Pharmacy  [id number] - [place], [place] - [address] AT [facility] [address] [facility]

## 2023-09-28 ENCOUNTER — Ambulatory Visit (INDEPENDENT_AMBULATORY_CARE_PROVIDER_SITE_OTHER): Payer: Medicaid Other

## 2023-09-28 VITALS — BP 117/78 | HR 83 | Ht 62.0 in | Wt 112.9 lb

## 2023-09-28 DIAGNOSIS — Z3042 Encounter for surveillance of injectable contraceptive: Secondary | ICD-10-CM | POA: Diagnosis not present

## 2023-09-28 MED ORDER — MEDROXYPROGESTERONE ACETATE 150 MG/ML IM SUSY
150.0000 mg | PREFILLED_SYRINGE | Freq: Once | INTRAMUSCULAR | Status: AC
  Administered 2023-09-28: 150 mg via INTRAMUSCULAR

## 2023-09-28 NOTE — Progress Notes (Signed)
    NURSE VISIT NOTE  Subjective:    Patient ID: Pamela Mosley, female    DOB: 1990/12/26, 32 y.o.   MRN: [id number]  HPI  Patient is a 32 y.o. [id number] female who presents for depo provera injection.   Objective:    BP 117/78   Pulse 83   Wt 112 lb 14.4 oz (51.2 kg)   Breastfeeding Unknown   BMI 20.65 kg/m   Last Annual: 07/08/23. Last pap: 02/12/2022. Last Depo-Provera: 07/01/23. Side Effects if any: none. Serum HCG indicated? No . Depo-Provera 150 mg IM given by: Beverely Pace, CMA. Site: Right Deltoid    Assessment:   1. Encounter for management and injection of depo-Provera      Plan:   Next appointment due between Feb-27-March-11     Loney Laurence, CMA

## 2023-12-22 ENCOUNTER — Ambulatory Visit: Payer: Medicaid Other

## 2023-12-27 ENCOUNTER — Ambulatory Visit (INDEPENDENT_AMBULATORY_CARE_PROVIDER_SITE_OTHER): Payer: Medicaid Other

## 2023-12-27 VITALS — BP 109/63 | HR 85 | Ht 62.0 in | Wt 115.1 lb

## 2023-12-27 DIAGNOSIS — Z3042 Encounter for surveillance of injectable contraceptive: Secondary | ICD-10-CM

## 2023-12-27 MED ORDER — MEDROXYPROGESTERONE ACETATE 150 MG/ML IM SUSP
150.0000 mg | Freq: Once | INTRAMUSCULAR | Status: AC
  Administered 2023-12-27: 150 mg via INTRAMUSCULAR

## 2023-12-27 NOTE — Progress Notes (Signed)
    NURSE VISIT NOTE  Subjective:    Patient ID: Pamela Mosley, female    DOB: 1991-09-26, 33 y.o.   MRN: [id number]  HPI  Patient is a 33 y.o. [id number] female who presents for depo provera injection.   Objective:    BP 109/63   Pulse 85   Ht 5\' 2"  (1.575 m)   Wt 115 lb 1.6 oz (52.2 kg)   BMI 21.05 kg/m   Last Annual: 07/08/23. Last pap: 02/12/22. Last Depo-Provera: 09/28/23. Side Effects if any: n/a. Serum HCG indicated? no. Depo-Provera 150 mg IM given by: Georgiana Shore, CMA. Site: Right Deltoid    Assessment:   1. Encounter for management and injection of depo-Provera      Plan:   Next appointment due between 03/13/24 and 03/27/24.    Loman Chroman, CMA

## 2024-03-20 ENCOUNTER — Ambulatory Visit (INDEPENDENT_AMBULATORY_CARE_PROVIDER_SITE_OTHER)

## 2024-03-20 VITALS — BP 122/86 | HR 98 | Ht 62.0 in | Wt 113.0 lb

## 2024-03-20 DIAGNOSIS — Z3042 Encounter for surveillance of injectable contraceptive: Secondary | ICD-10-CM

## 2024-03-20 MED ORDER — MEDROXYPROGESTERONE ACETATE 150 MG/ML IM SUSP
150.0000 mg | Freq: Once | INTRAMUSCULAR | Status: AC
  Administered 2024-03-20: 150 mg via INTRAMUSCULAR

## 2024-03-20 NOTE — Patient Instructions (Signed)
 Birth Control Shot: What to Expect A birth control shot prevents pregnancy. A birth control shot is put in (injected) into the skin or a muscle. The shot contains the hormone progestin. Hormones are chemicals that affect how the body works. Progestin prevents pregnancy because it: Stops the ovaries from releasing eggs. Makes cervical mucus thicker. This prevents sperm from getting into the cervix. The cervix is the lowest part of the uterus. Thins the lining of the uterus to prevent a fertilized egg from attaching to the uterus. Tell a health care provider about: Any allergies you have. All medicines you take. These include vitamins, herbs, eye drops, and creams. Any bleeding problems you have. Any medical problems you have. Whether you're pregnant or may be pregnant. What are the risks? Your health care provider will talk with you about risks. These may include: Mood changes or depression. Your bones becoming thinner or weaker. This is called loss of bone density. This can happen if you get the shots for a long period of time. This can cause your bones to break. Blood clots. These are rare. A higher risk of an egg being fertilized outside your uterus, called an ectopic pregnancy.This is rare. What happens before? Your provider may do a physical exam. You may have a test to make sure you aren't pregnant. What happens during a birth control shot?  The area where the shot will be given will be cleaned. A needle will be put into a muscle in your upper arm or butt, or into the skin of your thigh or belly. The needle will be put on a syringe with the medicine in it. The medicine will be pushed through the syringe into your body. A small bandage may be put over the place where the shot was given. What happens after? After the shot, it's common to have: Soreness around the place where the shot was given for a couple of days. Spotting or bleeding between periods. Weight gain. Tender  breasts. Headaches. Belly pain. Ask your provider if you need to use an added method of birth control, such as a condom, sponge, or spermicide. If the first shot is given 1-7 days after the start of your last period, you won't need to use an added method of birth control. If the first shot is given at any other time during your menstrual cycle, you'll need to use an added method of birth control for 7 days after you get the shot. Follow these instructions at home: General instructions Take your medicines only as told. Do not rub or massage the place where the shot was given. Track your periods. This will help you know if they become irregular. Always use a condom to protect against sexually transmitted infections (STIs). Make an appointment in time for your next shot and mark it on your calendar. You must get a shot every 3 months (12-13 weeks) to prevent pregnancy. Lifestyle Do not smoke, vape, or use nicotine or tobacco. Eat foods that are high in calcium and vitamin D, such as milk, cheese, and salmon. Calcium helps keep your bones strong and may help with any loss in bone density caused by the birth control shot. Ask your provider if you should take supplements. Contact a health care provider if you: Have discharge or bleeding from your vagina that isn't normal. Miss a period or think you might be pregnant. Have mood changes or depression. Feel dizzy or light-headed. Have leg pain. Get help right away if you: Have chest pain  or cough up blood. Have trouble breathing. Have a really bad headache that doesn't go away. Have numbness in any part of your body. Have really bad pain in your belly. Have slurred speech or vision problems. These symptoms may be an emergency. Call 911 right away. Do not wait to see if the symptoms will go away. Do not drive yourself to the hospital. This information is not intended to replace advice given to you by your health care provider. Make sure you  discuss any questions you have with your health care provider. Document Revised: 06/10/2023 Document Reviewed: 06/10/2023 Elsevier Patient Education  2024 [employer].

## 2024-03-20 NOTE — Progress Notes (Signed)
    NURSE VISIT NOTE  Subjective:    Patient ID: Pamela Mosley, female    DOB: [birth year], 33 y.o.   MRN: [id number]  HPI  Patient is a 33 y.o. [id number] female who presents for depo provera  injection.   Objective:    There were no vitals taken for this visit.  Last Annual: 07/08/23. Last pap: 02/12/22. Last Depo-Provera : 12/27/23. Side Effects if any: none. Serum HCG indicated? No . Depo-Provera  150 mg IM given by: Rian Chafe, CMA. Site: Right Ventrogluteal  Lab Review  No results found for any visits on 03/20/24.  Assessment:   1. Encounter for management and injection of depo-Provera       Plan:   Next appointment due between Aug18th and Sep 1st 2025.    Rian Chafe, CMA

## 2024-03-21 ENCOUNTER — Ambulatory Visit

## 2024-04-19 ENCOUNTER — Emergency Department
Admission: EM | Admit: 2024-04-19 | Discharge: 2024-04-20 | Disposition: A | Discharge status: Home / Self Care | Attending: Emergency Medicine | Admitting: Emergency Medicine

## 2024-04-19 ENCOUNTER — Other Ambulatory Visit: Payer: Self-pay

## 2024-04-19 ENCOUNTER — Emergency Department

## 2024-04-19 ENCOUNTER — Encounter: Payer: Self-pay | Admitting: *Deleted

## 2024-04-19 DIAGNOSIS — F17200 Nicotine dependence, unspecified, uncomplicated: Secondary | ICD-10-CM | POA: Insufficient documentation

## 2024-04-19 DIAGNOSIS — R0781 Pleurodynia: Secondary | ICD-10-CM | POA: Diagnosis present

## 2024-04-19 DIAGNOSIS — R079 Chest pain, unspecified: Secondary | ICD-10-CM

## 2024-04-19 LAB — TROPONIN I (HIGH SENSITIVITY): Troponin I (High Sensitivity): 2 ng/L (ref <18)

## 2024-04-19 LAB — BASIC METABOLIC PANEL WITH GFR
Anion gap: 11 (ref 5–15)
BUN: 9 mg/dL (ref 6–20)
CO2: 22 mmol/L (ref 22–32)
Calcium: 9.6 mg/dL (ref 8.9–10.3)
Chloride: 107 mmol/L (ref 98–111)
Creatinine, Ser: 0.57 mg/dL (ref 0.44–1.00)
GFR, Estimated: >60 mL/min (ref >60)
Glucose, Bld: 91 mg/dL (ref 70–99)
Potassium: 3.6 mmol/L (ref 3.5–5.1)
Sodium: 140 mmol/L (ref 135–145)

## 2024-04-19 LAB — CBC
HCT: 43 % (ref 36.0–46.0)
Hemoglobin: 15 g/dL (ref 12.0–15.0)
MCH: 33.6 pg (ref 26.0–34.0)
MCHC: 34.9 g/dL (ref 30.0–36.0)
MCV: 96.4 fL (ref 80.0–100.0)
Platelets: 267 10*3/uL (ref 150–400)
RBC: 4.46 MIL/uL (ref 3.87–5.11)
RDW: 11.9 % (ref 11.5–15.5)
WBC: 9.6 10*3/uL (ref 4.0–10.5)
nRBC: 0 % (ref 0.0–0.2)

## 2024-04-19 NOTE — ED Triage Notes (Signed)
 Pt ambulatory to triage.  Pt has left side chest pain radiating into left arm.  Intermittent sob.  Cig smoker.  Pt has a dry cough .  Pt alert  speech clear.

## 2024-04-20 LAB — TROPONIN I (HIGH SENSITIVITY): Troponin I (High Sensitivity): <2 ng/L (ref <18)

## 2024-04-20 LAB — D-DIMER, QUANTITATIVE: D-Dimer, Quant: <0.27 ug{FEU}/mL (ref 0.00–0.50)

## 2024-04-20 LAB — HCG, QUANTITATIVE, PREGNANCY: hCG, Beta Chain, Quant, S: <1 m[IU]/mL (ref <5)

## 2024-04-20 MED ORDER — LIDOCAINE 5 % EX PTCH
1.0000 | MEDICATED_PATCH | CUTANEOUS | Status: DC
  Administered 2024-04-20: 1 via TRANSDERMAL
  Filled 2024-04-20: qty 1

## 2024-04-20 MED ORDER — ACETAMINOPHEN 500 MG PO TABS
1000.0000 mg | ORAL_TABLET | Freq: Once | ORAL | Status: AC
  Administered 2024-04-20: 1000 mg via ORAL
  Filled 2024-04-20: qty 2

## 2024-04-20 NOTE — ED Provider Notes (Signed)
 [facility] Provider Note    Event Date/Time   First MD Initiated Contact with Patient 04/19/24 2350     (approximate)   History   Chest Pain   HPI  Pamela Mosley is a 33 y.o. female with history of adjustment disorder, daily smoker, presenting with chest pain.  Patient states that is pleuritic, started around 9.  Is mildly reproducible.  She denies any cough or viral illness, no recent travel or surgeries, no history of blood clots or cardiac issues, no leg swelling, no history of congestive heart failure or arrhythmias.  States that she is on Depo-Provera .  She denies any recent trauma or falls, no urinary symptoms or nausea vomiting or diarrhea.  No fevers.  States that she has not had pain like this in the past.  On independent review, she was seen by OB/GYN in September of last year, was there for annual [facility] exam, she had denies any symptoms, smokes 5 black and mild per week.     Physical Exam   Triage Vital Signs: ED Triage Vitals  Encounter Vitals Group     BP 04/19/24 2223 118/77     Girls Systolic BP Percentile --      Girls Diastolic BP Percentile --      Boys Systolic BP Percentile --      Boys Diastolic BP Percentile --      Pulse Rate 04/19/24 2223 87     Resp 04/19/24 2223 18     Temp 04/19/24 2223 98.5 F (36.9 C)     Temp Source 04/19/24 2223 Oral     SpO2 04/19/24 2223 100 %     Weight 04/19/24 2221 112 lb (50.8 kg)     Height 04/19/24 2221 5' 2 (1.575 m)     Head Circumference --      Peak Flow --      Pain Score 04/19/24 2220 7     Pain Loc --      Pain Education --      Exclude from Growth Chart --     Most recent vital signs: Vitals:   04/19/24 2223  BP: 118/77  Pulse: 87  Resp: 18  Temp: 98.5 F (36.9 C)  SpO2: 100%     General: Awake, no distress.  CV:  Good peripheral perfusion.  Resp:  Normal effort.  No tachypnea or respiratory distress, left lateral thoracic cage mildly tender to palpation  without overlying rash Abd:  No distention.  Soft nontender Other:  No lower extremity edema, no unilateral calf swelling or tenderness.   ED Results / Procedures / Treatments   Labs (all labs ordered are listed, but only abnormal results are displayed) Labs Reviewed  BASIC METABOLIC PANEL WITH GFR  CBC  D-DIMER, QUANTITATIVE  HCG, QUANTITATIVE, PREGNANCY  TROPONIN I (HIGH SENSITIVITY)  TROPONIN I (HIGH SENSITIVITY)     EKG  EKG shows, sinus rhythm, rate 89, normal QRS, normal Qtc, T wave flattening in aVL, no ischemic ST elevation, no prior to compare   RADIOLOGY On my independent interpretation, chest x-ray without focal consolidation   PROCEDURES:  Critical Care performed: No  Procedures   MEDICATIONS ORDERED IN ED: Medications  lidocaine  (LIDODERM ) 5 % 1 patch (1 patch Transdermal Patch Applied 04/20/24 0032)  acetaminophen  (TYLENOL ) tablet 1,000 mg (1,000 mg Oral Given 04/20/24 0031)     IMPRESSION / MDM / ASSESSMENT AND PLAN / ED COURSE  I reviewed the triage vital signs and  the nursing notes.                              Differential diagnosis includes, but is not limited to, costochondritis, musculoskeletal pain, strain, ACS, angina, GERD, did consider PE, she has no other risk factors other than being on Depo-Provera , will get D-dimer, labs, EKG, troponin, chest x-ray.  Tylenol , Lidoderm  patch.  Reassess.  Patient's presentation is most consistent with acute presentation with potential threat to life or bodily function.  Independent interpretation of labs and imaging below.  Troponin x 2 is negative, D-dimer is not elevated, considered but no indication for inpatient admission at this time, she is safe for outpatient management.  Will discharge strict return precautions.  Placed referral for cardiology as well as primary care, she can take Tylenol  every 6 hours as needed for pain.  Discharge with strict return precautions.  Shared decision making done with  patient and she is agreeable with this plan.  The patient is on the cardiac monitor to evaluate for evidence of arrhythmia and/or significant heart rate changes.   Clinical Course as of 04/20/24 0121  Wed Apr 19, 2024  2352 DG Chest 2 View No active cardiopulmonary disease.  [TT]  Thu Apr 20, 2024  0119 Independent review of labs, ECG is negative, troponin x 2 is negative, D-dimer is not elevated, electrolytes not severely deranged, no leukocytosis. [TT]    Clinical Course User Index [TT] Waymond, Lorelle Cummins, MD     FINAL CLINICAL IMPRESSION(S) / ED DIAGNOSES   Final diagnoses:  Chest pain, unspecified type     Rx / DC Orders   ED Discharge Orders          Ordered    Ambulatory referral to Cardiology       Comments: If you have not heard from the Cardiology office within the next 72 hours please call [phone].   04/20/24 0120    Ambulatory Referral to Primary Care (Establish Care)        04/20/24 0120             Note:  This document was prepared using Dragon voice recognition software and may include unintentional dictation errors.    Waymond Lorelle Cummins, MD 04/20/24 [phone]

## 2024-04-20 NOTE — Discharge Instructions (Signed)
 You can take 650 mg of Tylenol  every 6 hours as needed for pain.  Please also follow-up with primary care as well as cardiology, referrals for both have been placed.

## 2024-06-20 NOTE — Progress Notes (Unsigned)
  Cardiology Office Note   Date:  06/22/2024  ID:  [name], DOB 1991/04/06, MRN [id number] PCP: Pamela Mosley  [facility] Providers Cardiologist:  Pamela Poser, MD     History of Present Illness Pamela Mosley is a 33 y.o. female no significant PMH who presents for further evaluation management of chest discomfort.  Patient was seen in the ED on 04/19/2024.  She presented with pleuritic chest pain that was reproducible on exam.  Workup including D-dimer and troponin were negative.  ECG was unremarkable so she was sent home.  Patient reports a several year history of intermittent chest pain.  Chest pain is sharp in character without significant radiation.  Episodes are variable but typically last about an hour or so.  Episodes mainly occur at rest.  There is no exertional component.  There is no palpitations or syncope or other significantly high risk features.  She denies any known family history of SCD or other early ASCVD events.  Relevant CVD History -None   ROS: Pt denies any jaw pain, arm pain, palpitations, syncope, presyncope, orthopnea, PND, or LE edema.  Studies Reviewed I have independently reviewed the patient's ECG, medical records from recent ED visit, and recent blood work.  Physical Exam VS:  BP 116/80 (BP Location: Right Arm, Patient Position: Sitting, Cuff Size: Normal)   Pulse 69   Ht 5' 1 (1.549 m)   Wt 110 lb 6.4 oz (50.1 kg)   BMI 20.86 kg/m        Wt Readings from Last 3 Encounters:  06/22/24 110 lb 6.4 oz (50.1 kg)  04/19/24 112 lb (50.8 kg)  03/20/24 113 lb (51.3 kg)    GEN: No acute distress. NECK: No JVD; No carotid bruits. CARDIAC: RRR, no murmurs, rubs, gallops. RESPIRATORY:  Clear to auscultation. EXTREMITIES:  Warm and well-perfused. No edema.  ASSESSMENT AND PLAN Chest discomfort Patient presents with chest discomfort that has primarily noncardiac features.  Episodes occur at rest, sharp and stabbing in character, and  last for prolonged periods of time before abating.  She had a reassuring workup in the ED with negative troponin and D-dimer.  Given the intermittent nature of her symptoms, low suspicion for pericarditis.  She is overall very low pretest probability for obstructive CAD.  Plan: - Echocardiogram to evaluate for potential structural contribution to her symptoms - Zio monitor to rule out arrhythmia  Tobacco use Counseled on smoking cessation in order to reduce her longitudinal ASCVD risk.  She is contemplative.  Will set her up with our tobacco cessation services.          Dispo: RTC 1 year or sooner as needed  Signed, Pamela Poser, MD

## 2024-06-22 ENCOUNTER — Ambulatory Visit (INDEPENDENT_AMBULATORY_CARE_PROVIDER_SITE_OTHER)

## 2024-06-22 ENCOUNTER — Ambulatory Visit

## 2024-06-22 VITALS — BP 116/80 | HR 69 | Ht 61.0 in | Wt 110.4 lb

## 2024-06-22 DIAGNOSIS — F17200 Nicotine dependence, unspecified, uncomplicated: Secondary | ICD-10-CM | POA: Insufficient documentation

## 2024-06-22 DIAGNOSIS — R079 Chest pain, unspecified: Secondary | ICD-10-CM | POA: Diagnosis present

## 2024-06-22 NOTE — Patient Instructions (Signed)
 Medication Instructions:   Your physician recommends that you continue on your current medications as directed. Please refer to the Current Medication list given to you today.   *If you need a refill on your cardiac medications before your next appointment, please call your pharmacy*  Lab Work:  No labs ordered today   If you have labs (blood work) drawn today and your tests are completely normal, you will receive your results only by: MyChart Message (if you have MyChart) OR A paper copy in the mail If you have any lab test that is abnormal or we need to change your treatment, we will call you to review the results.  Testing/Procedures:  Your physician has requested that you have an echocardiogram. Echocardiography is a painless test that uses sound waves to create images of your heart. It provides your doctor with information about the size and shape of your heart and how well your heart's chambers and valves are working.   You may receive an ultrasound enhancing agent through an IV if needed to better visualize your heart during the echo. This procedure takes approximately one hour.  There are no restrictions for this procedure.  This will take place at [address] [facility], [place] [address]  Please note: We ask at that you not bring children with you during ultrasound (echo/ vascular) testing. Due to room size and safety concerns, children are not allowed in the ultrasound rooms during exams. Our front office staff cannot provide observation of children in our lobby area while testing is being conducted. An adult accompanying a patient to their appointment will only be allowed in the ultrasound room at the discretion of the ultrasound technician under special circumstances. We apologize for any inconvenience.   Your physician has recommended that you wear a Zio monitor.   This monitor is a medical device that records the heart's electrical activity.  Doctors most often use these monitors to diagnose arrhythmias. Arrhythmias are problems with the speed or rhythm of the heartbeat. The monitor is a small device applied to your chest. You can wear one while you do your normal daily activities. While wearing this monitor if you have any symptoms to push the button and record what you felt. Once you have worn this monitor for the period of time provider prescribed (Usually 14 days), you will return the monitor device in the postage paid box. Once it is returned they will download the data collected and provide us  with a report which the provider will then review and we will call you with those results. Important tips:  Avoid showering during the first 24 hours of wearing the monitor. Avoid excessive sweating to help maximize wear time. Do not submerge the device, no hot tubs, and no swimming pools. Keep any lotions or oils away from the patch. After 24 hours you may shower with the patch on. Take brief showers with your back facing the shower head.  Do not remove patch once it has been placed because that will interrupt data and decrease adhesive wear time. Push the button when you have any symptoms and write down what you were feeling. Once you have completed wearing your monitor, remove and place into box which has postage paid and place in your outgoing mailbox.  If for some reason you have misplaced your box then call our office and we can provide another box and/or mail it off for you.   You can scan the QR code below and  download app for further instructions.      Follow-Up:  Referral for Virtual Smoking Cessation Session  At [facility], you and your health needs are our priority.  As part of our continuing mission to provide you with exceptional heart care, our providers are all part of one team.  This team includes your primary Cardiologist (physician) and Advanced Practice Providers or APPs (Physician Assistants and Nurse  Practitioners) who all work together to provide you with the care you need, when you need it.  Your next appointment:   12 month(s)  Provider:   You may see Caron Poser, MD  We recommend signing up for the patient portal called MyChart.  Sign up information is provided on this After Visit Summary.  MyChart is used to connect with patients for Virtual Visits (Telemedicine).  Patients are able to view lab/test results, encounter notes, upcoming appointments, etc.  Non-urgent messages can be sent to your provider as well.   To learn more about what you can do with MyChart, go to [email or web address].

## 2024-06-30 ENCOUNTER — Ambulatory Visit
Admission: EM | Admit: 2024-06-30 | Discharge: 2024-06-30 | Disposition: A | Discharge status: Home / Self Care | Attending: Emergency Medicine | Admitting: Emergency Medicine

## 2024-06-30 ENCOUNTER — Encounter: Payer: Self-pay | Admitting: Emergency Medicine

## 2024-06-30 DIAGNOSIS — J069 Acute upper respiratory infection, unspecified: Secondary | ICD-10-CM | POA: Diagnosis present

## 2024-06-30 DIAGNOSIS — F17200 Nicotine dependence, unspecified, uncomplicated: Secondary | ICD-10-CM

## 2024-06-30 LAB — SARS CORONAVIRUS 2 BY RT PCR: SARS Coronavirus 2 by RT PCR: NEGATIVE

## 2024-06-30 MED ORDER — ALBUTEROL SULFATE HFA 108 (90 BASE) MCG/ACT IN AERS: 2.0000 | INHALATION_SPRAY | RESPIRATORY_TRACT | 0 refills | Status: AC | PRN

## 2024-06-30 MED ORDER — ALBUTEROL SULFATE HFA 108 (90 BASE) MCG/ACT IN AERS
2.0000 | INHALATION_SPRAY | RESPIRATORY_TRACT | 0 refills | Status: DC | PRN
Start: 2024-06-30 — End: 2024-06-30

## 2024-06-30 MED ORDER — BENZONATATE 100 MG PO CAPS: 100.0000 mg | ORAL_CAPSULE | Freq: Three times a day (TID) | ORAL | 0 refills | Status: AC

## 2024-06-30 NOTE — ED Provider Notes (Signed)
 [facility]    CSN: [id number] Arrival date & time: 06/30/24  0956      History   Chief Complaint Chief Complaint  Patient presents with   Cough    HPI Pamela Mosley is a 33 y.o. female.   33 year old female pt, Pamela Mosley, presents to urgent care for evaluation of  cough,congestion, SOB/soreness of rib cage with cough.   EFY:Drnopndpd, + smoker  The history is provided by the patient. No language interpreter was used.    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 Authorizing Provider  benzonatate  (TESSALON ) 100 MG capsule Take 1 capsule (100 mg total) by mouth every 8 (eight) hours. 06/30/24  Yes Chiante Peden, Rilla, NP  albuterol  (VENTOLIN  HFA) 108 (90 Base) MCG/ACT inhaler Inhale 2 puffs into the lungs every 4 (four) hours as needed  for wheezing or shortness of breath. 06/30/24   Patryck Kilgore, NP  conjugated estrogens  (PREMARIN ) vaginal cream Place 0.25 Applicatorfuls vaginally daily. Apply nightly for 2 weeks, then twice a week as needed 07/12/23   Justino Eleanor HERO, CNM  medroxyPROGESTERone  (DEPO-PROVERA ) 150 MG/ML injection Inject 1 mL (150 mg total) into the muscle every 3 (three) months. 10/01/22   Sebastian Sham, CNM  Prenatal Vit-Fe Fumarate-FA (PRENATAL 19) tablet Chew 1 tablet by mouth daily.    [provider]  valACYclovir  (VALTREX ) 500 MG tablet Take 1 tablet (500 mg total) by mouth 2 (two) times daily. Start at 36 weeks Patient taking differently: Take 500 mg by mouth as needed. Start at 36 weeks 07/15/22   Justino Eleanor HERO, CNM    Family History Family History  Problem Relation Age of Onset   Hypertension Mother    Kidney disease Mother    Stroke Mother    Alcohol abuse Mother    Drug abuse Mother    Osteoarthritis Mother    Aneurysm Mother    Migraines Mother    Drug abuse Father    Cancer Father  Hyperlipidemia Father    Hypertension Father    Gestational diabetes Sister    Heart attack Maternal Grandmother    Heart murmur Maternal Grandmother     Social History Social History   Tobacco Use   Smoking status: Every Day    Types: Cigars   Smokeless tobacco: Never   Tobacco comments:    1 black and mild daily  Vaping Use   Vaping status: Never Used  Substance Use Topics   Alcohol use: Not Currently    Comment: socially   Drug use: Not Currently    Frequency: 2.0 times per week    Types: Marijuana     Allergies   Latex   Review of Systems Review of Systems  Constitutional:  Negative for fever.  HENT:  Positive for congestion.   Respiratory:  Positive for cough and shortness of breath.   Cardiovascular:  Negative for chest pain and palpitations.  All other systems reviewed and are negative.    Physical Exam Triage Vital Signs ED Triage Vitals   Encounter Vitals Group     BP 06/30/24 1005 (!) 135/96     Girls Systolic BP Percentile --      Girls Diastolic BP Percentile --      Boys Systolic BP Percentile --      Boys Diastolic BP Percentile --      Pulse Rate 06/30/24 1005 96     Resp 06/30/24 1005 14     Temp 06/30/24 1005 99.1 F (37.3 C)     Temp Source 06/30/24 1005 Oral     SpO2 06/30/24 1005 96 %     Weight 06/30/24 1003 110 lb (49.9 kg)     Height 06/30/24 1003 5' 1 (1.549 m)     Head Circumference --      Peak Flow --      Pain Score 06/30/24 1003 7     Pain Loc --      Pain Education --      Exclude from Growth Chart --    No data found.  Updated Vital Signs BP (!) 135/96 (BP Location: Right Arm)   Pulse 96   Temp 99.1 F (37.3 C) (Oral)   Resp 14   Ht 5' 1 (1.549 m)   Wt 110 lb (49.9 kg)   SpO2 96%   Breastfeeding No   BMI 20.78 kg/m   Visual Acuity Right Eye Distance:   Left Eye Distance:   Bilateral Distance:    Right Eye Near:   Left Eye Near:    Bilateral Near:     Physical Exam Vitals and nursing note reviewed.  Constitutional:      General: She is not in acute distress.    Appearance: She is well-developed.  HENT:     Head: Normocephalic.     Right Ear: Tympanic membrane is retracted.     Left Ear: Tympanic membrane is retracted.     Nose: Mucosal edema and congestion present.     Mouth/Throat:     Lips: Pink.     Mouth: Mucous membranes are moist.     Pharynx: Oropharynx is clear. Uvula midline.  Eyes:     General: Lids are normal.     Conjunctiva/sclera: Conjunctivae normal.     Pupils: Pupils are equal, round, and reactive to light.  Neck:     Trachea: No tracheal deviation.  Cardiovascular:     Rate and Rhythm: Normal rate and regular rhythm.  Heart sounds: Normal heart sounds. No murmur heard. Pulmonary:     Effort: Pulmonary effort is normal.     Breath sounds: Normal breath sounds and air entry.  Abdominal:     General: Bowel sounds are normal.      Palpations: Abdomen is soft.     Tenderness: There is no abdominal tenderness.  Musculoskeletal:        General: Normal range of motion.     Cervical back: Normal range of motion.  Lymphadenopathy:     Cervical: No cervical adenopathy.  Skin:    General: Skin is warm and dry.     Findings: No rash.  Neurological:     General: No focal deficit present.     Mental Status: She is alert and oriented to person, place, and time.     GCS: GCS eye subscore is 4. GCS verbal subscore is 5. GCS motor subscore is 6.  Psychiatric:        Attention and Perception: Attention normal.        Mood and Affect: Mood normal.        Speech: Speech normal.        Behavior: Behavior normal. Behavior is cooperative.      UC Treatments / Results  Labs (all labs ordered are listed, but only abnormal results are displayed) Labs Reviewed  SARS CORONAVIRUS 2 BY RT PCR    EKG   Radiology No results found.  Procedures Procedures (including critical care time)  Medications Ordered in UC Medications - No data to display  Initial Impression / Assessment and Plan / UC Course  I have reviewed the triage vital signs and the nursing notes.  Pertinent labs & imaging results that were available during my care of the patient were reviewed by me and considered in my medical decision making (see chart for details).    Discussed exam findings and plan of care with patient, covid negative, scripted albuterol /tessalon , stop smoking, strict go to ER precautions given.   Patient verbalized understanding to this provider.  Ddx: Viral uri with cough,smoker, allergies Final Clinical Impressions(s) / UC Diagnoses   Final diagnoses:  Viral URI with cough  Smoker     Discharge Instructions      Most likely you have a viral illness: your covid test is negative, no antibiotic is indicated at this time, May treat with OTC meds of choice(chloraseptic throat lozenges, tylenol , etc). Make sure to drink plenty of  fluids to stay hydrated(gatorade, water, popsicles,jello,etc), avoid caffeine products.  Stop smoking. Follow up with PCP-call for appt next week if symptoms persist. If you develop chest pain, shortness of breath, or worsening symptoms go to the emergency room for further evaluation    ED Prescriptions     Medication Sig Dispense Auth. Provider   benzonatate  (TESSALON ) 100 MG capsule Take 1 capsule (100 mg total) by mouth every 8 (eight) hours. 21 capsule Candance Bohlman, NP   albuterol  (VENTOLIN  HFA) 108 (90 Base) MCG/ACT inhaler  (Status: Discontinued) Inhale 2 puffs into the lungs every 4 (four) hours as needed for wheezing or shortness of breath. 1 each Hettie Roselli, Rilla, NP   albuterol  (VENTOLIN  HFA) 108 (90 Base) MCG/ACT inhaler Inhale 2 puffs into the lungs every 4 (four) hours as needed for wheezing or shortness of breath. 1 each Jeovanni Heuring, Rilla, NP      PDMP not reviewed this encounter.   Aminta Rilla, NP 06/30/24 1901

## 2024-06-30 NOTE — ED Triage Notes (Signed)
 Patient reports cough and chest congestion that started 3 days ago.  Patient reports SOB that started yesterday.  Patient reports soreness in her ribcage.  Patient denies fevers.

## 2024-06-30 NOTE — Discharge Instructions (Addendum)
 Most likely you have a viral illness: your covid test is negative, no antibiotic is indicated at this time, May treat with OTC meds of choice(chloraseptic throat lozenges, tylenol , etc). Make sure to drink plenty of fluids to stay hydrated(gatorade, water, popsicles,jello,etc), avoid caffeine products.  Stop smoking. Follow up with PCP-call for appt next week if symptoms persist. If you develop chest pain, shortness of breath, or worsening symptoms go to the emergency room for further evaluation

## 2024-07-14 ENCOUNTER — Ambulatory Visit

## 2024-07-14 VITALS — BP 106/77 | HR 84 | Ht 62.0 in | Wt 106.0 lb

## 2024-07-14 DIAGNOSIS — Z3042 Encounter for surveillance of injectable contraceptive: Secondary | ICD-10-CM | POA: Diagnosis not present

## 2024-07-14 DIAGNOSIS — Z3202 Encounter for pregnancy test, result negative: Secondary | ICD-10-CM | POA: Diagnosis not present

## 2024-07-14 LAB — POCT URINE PREGNANCY: Preg Test, Ur: NEGATIVE

## 2024-07-14 MED ORDER — MEDROXYPROGESTERONE ACETATE 150 MG/ML IM SUSP
150.0000 mg | Freq: Once | INTRAMUSCULAR | Status: AC
  Administered 2024-07-14: 150 mg via INTRAMUSCULAR

## 2024-07-14 NOTE — Progress Notes (Signed)
    NURSE VISIT NOTE  Subjective:    Patient ID: Pamela Mosley, female    DOB: 04-12-1991, 33 y.o.   MRN: [id number]  HPI  Patient is a 33 y.o. [id number] female who presents for depo provera  injection.   Objective:    BP 106/77   Pulse 84   Ht 5' 2 (1.575 m)   Wt 106 lb (48.1 kg)   Breastfeeding No   BMI 19.39 kg/m   Last Annual: 07/08/23. Last pap: 01/23/22. Last Depo-Provera : 03/20/24. Side Effects if any: none. Serum HCG indicated? No . Depo-Provera  150 mg IM given by: Rollo Maxin, CMA. Site: Left Ventrogluteal  Lab Review  Results for orders placed or performed in visit on 07/14/24  POCT urine pregnancy  Result Value Ref Range   Preg Test, Ur Negative Negative    Assessment:   1. Encounter for management and injection of depo-Provera       Plan:   Next appointment due between 09/29/24 and 10/13/24.    Rollo JINNY Maxin, CMA

## 2024-07-14 NOTE — Patient Instructions (Signed)

## 2024-07-17 ENCOUNTER — Ambulatory Visit: Payer: Self-pay

## 2024-07-17 DIAGNOSIS — R079 Chest pain, unspecified: Secondary | ICD-10-CM

## 2024-08-09 ENCOUNTER — Ambulatory Visit

## 2024-09-26 ENCOUNTER — Ambulatory Visit (HOSPITAL_COMMUNITY)

## 2024-10-06 ENCOUNTER — Ambulatory Visit (INDEPENDENT_AMBULATORY_CARE_PROVIDER_SITE_OTHER): Payer: Self-pay

## 2024-10-06 DIAGNOSIS — Z3042 Encounter for surveillance of injectable contraceptive: Secondary | ICD-10-CM

## 2024-10-06 MED ORDER — MEDROXYPROGESTERONE ACETATE 150 MG/ML IM SUSP
150.0000 mg | Freq: Once | INTRAMUSCULAR | Status: AC
  Administered 2024-10-06: 150 mg via INTRAMUSCULAR

## 2024-10-06 NOTE — Progress Notes (Signed)
" ° ° °  NURSE VISIT NOTE  Subjective:    Patient ID: Pamela Mosley, female    DOB: [birth year], 33 y.o.   MRN: [id number]  HPI  Patient is a 33 y.o. [id number] female who presents for depo provera  injection.   Objective:    There were no vitals taken for this visit.  Last Annual: 07/08/23. Last pap: 01/23/22. Last Depo-Provera : 07/14/24. Side Effects if any: none. Serum HCG indicated? No . Depo-Provera  150 mg IM given by: Mathis Getting, CMA. Site: Left Deltoid  Lab Review  No results found for any visits on 10/06/24.  Assessment:   1. Encounter for management and injection of depo-Provera       Plan:   Next appointment due between 12/22/24 and 01/05/25.    Mathis LITTIE Getting, CMA  "

## 2024-10-06 NOTE — Patient Instructions (Signed)

## 2024-12-29 ENCOUNTER — Ambulatory Visit: Payer: Self-pay
